# Patient Record
Sex: Female | Born: 1995 | Race: White | Hispanic: No | Marital: Married | Smoking: Former smoker
Health system: Southern US, Community
[De-identification: ages and names within clinical notes are randomized; demographics above are authoritative.]

## PROBLEM LIST (undated history)

## (undated) ENCOUNTER — Inpatient Hospital Stay: Payer: Self-pay

## (undated) DIAGNOSIS — F419 Anxiety disorder, unspecified: Secondary | ICD-10-CM

## (undated) DIAGNOSIS — R000 Tachycardia, unspecified: Secondary | ICD-10-CM

## (undated) DIAGNOSIS — F3181 Bipolar II disorder: Secondary | ICD-10-CM

## (undated) DIAGNOSIS — G43909 Migraine, unspecified, not intractable, without status migrainosus: Secondary | ICD-10-CM

## (undated) DIAGNOSIS — F329 Major depressive disorder, single episode, unspecified: Secondary | ICD-10-CM

## (undated) DIAGNOSIS — F32A Depression, unspecified: Secondary | ICD-10-CM

## (undated) HISTORY — DX: Anxiety disorder, unspecified: F41.9

## (undated) HISTORY — DX: Tachycardia, unspecified: R00.0

## (undated) HISTORY — PX: CHOLECYSTECTOMY: SHX55

## (undated) HISTORY — DX: Migraine, unspecified, not intractable, without status migrainosus: G43.909

## (undated) HISTORY — DX: Depression, unspecified: F32.A

## (undated) HISTORY — DX: Major depressive disorder, single episode, unspecified: F32.9

## (undated) HISTORY — DX: Bipolar II disorder: F31.81

---
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
## 2013-12-05 ENCOUNTER — Emergency Department: Payer: Self-pay | Admitting: Emergency Medicine

## 2013-12-05 LAB — BASIC METABOLIC PANEL
ANION GAP: 4 — AB (ref 7–16)
BUN: 12 mg/dL (ref 7–18)
CALCIUM: 9 mg/dL (ref 8.5–10.1)
Chloride: 107 mmol/L (ref 98–107)
Co2: 27 mmol/L (ref 21–32)
Creatinine: 0.83 mg/dL (ref 0.60–1.30)
GLUCOSE: 88 mg/dL (ref 65–99)
Osmolality: 275 (ref 275–301)
POTASSIUM: 3.9 mmol/L (ref 3.5–5.1)
Sodium: 138 mmol/L (ref 136–145)

## 2013-12-05 LAB — CBC
HCT: 43.2 % (ref 35.0–47.0)
HGB: 14.1 g/dL (ref 12.0–16.0)
MCH: 28.1 pg (ref 26.0–34.0)
MCHC: 32.6 g/dL (ref 32.0–36.0)
MCV: 86 fL (ref 80–100)
Platelet: 207 10*3/uL (ref 150–440)
RBC: 5.01 10*6/uL (ref 3.80–5.20)
RDW: 14 % (ref 11.5–14.5)
WBC: 6.3 10*3/uL (ref 3.6–11.0)

## 2013-12-05 LAB — URINALYSIS, COMPLETE
BACTERIA: NONE SEEN
BILIRUBIN, UR: NEGATIVE
BLOOD: NEGATIVE
GLUCOSE, UR: NEGATIVE mg/dL (ref 0–75)
KETONE: NEGATIVE
LEUKOCYTE ESTERASE: NEGATIVE
Nitrite: NEGATIVE
PH: 6 (ref 4.5–8.0)
PROTEIN: NEGATIVE
RBC,UR: 1 /HPF (ref 0–5)
SPECIFIC GRAVITY: 1.017 (ref 1.003–1.030)
Squamous Epithelial: 3

## 2013-12-08 ENCOUNTER — Encounter: Payer: Self-pay | Admitting: *Deleted

## 2013-12-08 ENCOUNTER — Encounter: Payer: Self-pay | Admitting: Cardiovascular Disease

## 2013-12-08 ENCOUNTER — Ambulatory Visit (INDEPENDENT_AMBULATORY_CARE_PROVIDER_SITE_OTHER): Payer: Self-pay | Admitting: Cardiovascular Disease

## 2013-12-08 VITALS — BP 128/80 | HR 89 | Ht 65.0 in | Wt 210.5 lb

## 2013-12-08 DIAGNOSIS — R079 Chest pain, unspecified: Secondary | ICD-10-CM | POA: Insufficient documentation

## 2013-12-08 DIAGNOSIS — R000 Tachycardia, unspecified: Secondary | ICD-10-CM | POA: Insufficient documentation

## 2013-12-08 DIAGNOSIS — R0602 Shortness of breath: Secondary | ICD-10-CM

## 2013-12-08 NOTE — Progress Notes (Signed)
   HPI  This is a 18 year old female who was referred from the emergency room at [facility] for evaluation of chest pain and dyspnea. She has no previous cardiac history and no chronic medical conditions. She has experienced palpitations and tachycardia throughout her life. She was started on propranolol about one month ago for that reason with partial improvement in symptoms. Over the last few months, she has experienced symptoms of substernal chest pain and shortness of breath. The pain is described as aching sensation with no radiation. It happens mostly at night and it wakes her up from sleep. There is no family history of heart disease. She smokes a few cigarettes a day and drinks alcohol socially. She denies any recreational drug use. She had routine labs done in March which was within normal limits including thyroid function.  Allergies  Allergen Reactions  . Imitrex [Sumatriptan]      No current outpatient prescriptions on file prior to visit.   No current facility-administered medications on file prior to visit.     History reviewed. No pertinent past medical history.   History reviewed. No pertinent past surgical history.   History reviewed. No pertinent family history.   History   Social History  . Marital Status: Single    Spouse Name: N/A    Number of Children: N/A  . Years of Education: N/A   Occupational History  . Not on file.   Social History Main Topics  . Smoking status: Current Some Day Smoker -- 2 years    Types: Cigarettes  . Smokeless tobacco: Not on file  . Alcohol Use: Yes     Comment: social  . Drug Use: No  . Sexual Activity: Not on file   Other Topics Concern  . Not on file   Social History Narrative  . No narrative on file     ROS A 10 point review of system was performed. It is negative other than that mentioned in the history of present illness.   PHYSICAL EXAM   BP 128/80  Pulse 89  Ht 5\' 5"  (1.651 m)  Wt 210 lb 8 oz (95.482  kg)  BMI 35.03 kg/m2 Constitutional: She is oriented to person, place, and time. She appears well-developed and well-nourished. No distress.  HENT: No nasal discharge.  Head: Normocephalic and atraumatic.  Eyes: Pupils are equal and round. No discharge.  Neck: Normal range of motion. Neck supple. No JVD present. No thyromegaly present.  Cardiovascular: Normal rate, regular rhythm, normal heart sounds. Exam reveals no gallop and no friction rub. No murmur heard.  Pulmonary/Chest: Effort normal and breath sounds normal. No stridor. No respiratory distress. She has no wheezes. She has no rales. She exhibits no tenderness.  Abdominal: Soft. Bowel sounds are normal. She exhibits no distension. There is no tenderness. There is no rebound and no guarding.  Musculoskeletal: Normal range of motion. She exhibits no edema and no tenderness.  Neurological: She is alert and oriented to person, place, and time. Coordination normal.  Skin: Skin is warm and dry. No rash noted. She is not diaphoretic. No erythema. No pallor.  Psychiatric: She has a normal mood and affect. Her behavior is normal. Judgment and thought content normal.     ZOX:WRUEAEKG:Sinus  Rhythm  WITHIN NORMAL LIMITS   ASSESSMENT AND PLAN

## 2013-12-08 NOTE — Assessment & Plan Note (Signed)
The chest pain is atypical and not anginal in nature. She complains of associated shortness of breath. Cardiac physical exam is unremarkable and baseline ECG is normal. I recommend an echocardiogram to ensure no structural heart abnormalities. If the echocardiogram is unremarkable, no further cardiac testing is advised. Evaluation of noncardiac causes might be appropriate.

## 2013-12-08 NOTE — Patient Instructions (Signed)
Your physician has requested that you have an echocardiogram. Echocardiography is a painless test that uses sound waves to create images of your heart. It provides your doctor with information about the size and shape of your heart and how well your heart's chambers and valves are working. This procedure takes approximately one hour. There are no restrictions for this procedure.   Your physician recommends that you schedule a follow-up appointment in:  Follow up as needed

## 2013-12-08 NOTE — Assessment & Plan Note (Signed)
This seems to be due to sinus tachycardia according to her description. When she feels palpitations, heart rate is typically not more than 120 beats per minute. There has been no syncope or presyncope. The utility of Holter monitor is very low in this situation. Symptoms improved with propranolol.

## 2013-12-20 ENCOUNTER — Other Ambulatory Visit: Payer: Self-pay

## 2013-12-20 ENCOUNTER — Other Ambulatory Visit (INDEPENDENT_AMBULATORY_CARE_PROVIDER_SITE_OTHER): Payer: Self-pay

## 2013-12-20 DIAGNOSIS — R079 Chest pain, unspecified: Secondary | ICD-10-CM

## 2013-12-20 DIAGNOSIS — R0602 Shortness of breath: Secondary | ICD-10-CM

## 2014-01-06 ENCOUNTER — Telehealth: Payer: Self-pay

## 2014-01-06 ENCOUNTER — Ambulatory Visit (INDEPENDENT_AMBULATORY_CARE_PROVIDER_SITE_OTHER): Payer: Self-pay | Admitting: *Deleted

## 2014-01-06 ENCOUNTER — Emergency Department: Payer: Self-pay | Admitting: Emergency Medicine

## 2014-01-06 VITALS — BP 146/90 | HR 80 | Ht 65.0 in | Wt 208.0 lb

## 2014-01-06 DIAGNOSIS — R079 Chest pain, unspecified: Secondary | ICD-10-CM

## 2014-01-06 LAB — COMPREHENSIVE METABOLIC PANEL
ALBUMIN: 3.8 g/dL (ref 3.4–5.0)
ALT: 22 U/L (ref 12–78)
AST: 11 U/L — AB (ref 15–37)
Alkaline Phosphatase: 69 U/L
Anion Gap: 8 (ref 7–16)
BUN: 12 mg/dL (ref 7–18)
Bilirubin,Total: 0.3 mg/dL (ref 0.2–1.0)
CHLORIDE: 104 mmol/L (ref 98–107)
Calcium, Total: 9.2 mg/dL (ref 8.5–10.1)
Co2: 27 mmol/L (ref 21–32)
Creatinine: 0.84 mg/dL (ref 0.60–1.30)
EGFR (African American): >60
GLUCOSE: 115 mg/dL — AB (ref 65–99)
Osmolality: 278 (ref 275–301)
Potassium: 3.3 mmol/L — ABNORMAL LOW (ref 3.5–5.1)
Sodium: 139 mmol/L (ref 136–145)
Total Protein: 7.2 g/dL (ref 6.4–8.2)

## 2014-01-06 LAB — CBC WITH DIFFERENTIAL/PLATELET
Basophil #: 0 10*3/uL (ref 0.0–0.1)
Basophil %: 0.3 %
EOS ABS: 0 10*3/uL (ref 0.0–0.7)
Eosinophil %: 0.4 %
HCT: 43.5 % (ref 35.0–47.0)
HGB: 14.2 g/dL (ref 12.0–16.0)
LYMPHS ABS: 1.3 10*3/uL (ref 1.0–3.6)
Lymphocyte %: 14.8 %
MCH: 28.4 pg (ref 26.0–34.0)
MCHC: 32.6 g/dL (ref 32.0–36.0)
MCV: 87 fL (ref 80–100)
MONO ABS: 0.6 x10 3/mm (ref 0.2–0.9)
Monocyte %: 6.7 %
NEUTROS ABS: 6.6 10*3/uL — AB (ref 1.4–6.5)
Neutrophil %: 77.8 %
Platelet: 210 10*3/uL (ref 150–440)
RBC: 5 10*6/uL (ref 3.80–5.20)
RDW: 13.6 % (ref 11.5–14.5)
WBC: 8.5 10*3/uL (ref 3.6–11.0)

## 2014-01-06 LAB — URINALYSIS, COMPLETE
BILIRUBIN, UR: NEGATIVE
Glucose,UR: NEGATIVE mg/dL (ref 0–75)
Leukocyte Esterase: NEGATIVE
Nitrite: NEGATIVE
Ph: 6 (ref 4.5–8.0)
Protein: NEGATIVE
RBC,UR: 6 /HPF (ref 0–5)
Specific Gravity: 1.026 (ref 1.003–1.030)
Squamous Epithelial: 26

## 2014-01-06 LAB — PREGNANCY, URINE: Pregnancy Test, Urine: NEGATIVE m[IU]/mL

## 2014-01-06 LAB — TROPONIN I: Troponin-I: <0.02 ng/mL

## 2014-01-06 LAB — LIPASE, BLOOD: LIPASE: 101 U/L (ref 73–393)

## 2014-01-06 NOTE — Progress Notes (Signed)
1.) Reason for visit: Chest and upper abdominal pain   2.) Name of MD requesting visit: Dr. Kirke CorinArida   3.) H&P:   18 yr old female   Seen at [facility] ED for chest pain   Normal EKG and Echo   4.) ROS related to problem:   Patient called today with mid sternal and upper abdomen pain   She stated it was 8/10 pain that felt "burning and gnawing"   She stated it was sharp and radiating at times   She denies indigestion or flatulence   She had pizza and macaroni and cheese to eat last night   She was flushed and clammy to the touch   Her blood pressure was mildly elevated    She became nauseous and almost vomited during her office visit   Assessment and plan per MD:  I instructed patient to: Rest at home  Follow a bland diet  Avoid spicy, fatty, acidic foods  Avoid alcohol and soft drinks  Try OTC antacid  Follow up with PCP on Monday or go to urgent care today   I advised her to go to the ED if she felt her situation became emergent  She stated she was afraid to return home in so much pain  She decided to go to the ED after leaving our office  I called ED Charge Nurse Tammy Sours(Greg) to let him know she was heading over

## 2014-01-06 NOTE — Telephone Encounter (Signed)
Patient called with chest pain and shortness of breath  It starts at the top of her chest and goes to the middle of her stomach  Feels like pressure, burning and pulling  Patient woke up feeling mild chest pain She took a nap and when she woke up it was worse   Advised patient to come in for nurse visit and EKG  Patient stated she will come in now

## 2014-01-06 NOTE — Telephone Encounter (Signed)
Pt called, states her "chest pains have flared up again"

## 2014-01-06 NOTE — Patient Instructions (Signed)
Please go to ED if your abdominal pain continues for abdominal ultra sound

## 2014-03-15 ENCOUNTER — Inpatient Hospital Stay: Payer: Self-pay | Admitting: Internal Medicine

## 2014-03-15 LAB — CBC WITH DIFFERENTIAL/PLATELET
BASOS PCT: 0.6 %
Basophil #: 0 10*3/uL (ref 0.0–0.1)
Eosinophil #: 0.1 10*3/uL (ref 0.0–0.7)
Eosinophil %: 0.8 %
HCT: 43.6 % (ref 35.0–47.0)
HGB: 14.6 g/dL (ref 12.0–16.0)
Lymphocyte #: 1.9 10*3/uL (ref 1.0–3.6)
Lymphocyte %: 28.7 %
MCH: 28.9 pg (ref 26.0–34.0)
MCHC: 33.4 g/dL (ref 32.0–36.0)
MCV: 87 fL (ref 80–100)
Monocyte #: 0.7 x10 3/mm (ref 0.2–0.9)
Monocyte %: 10 %
NEUTROS ABS: 4 10*3/uL (ref 1.4–6.5)
Neutrophil %: 59.9 %
Platelet: 238 10*3/uL (ref 150–440)
RBC: 5.04 10*6/uL (ref 3.80–5.20)
RDW: 14.1 % (ref 11.5–14.5)
WBC: 6.7 10*3/uL (ref 3.6–11.0)

## 2014-03-15 LAB — COMPREHENSIVE METABOLIC PANEL
ALT: 379 U/L — AB
Albumin: 3.8 g/dL (ref 3.4–5.0)
Alkaline Phosphatase: 182 U/L — ABNORMAL HIGH
Anion Gap: 8 (ref 7–16)
BUN: 9 mg/dL (ref 7–18)
Bilirubin,Total: 0.6 mg/dL (ref 0.2–1.0)
CO2: 29 mmol/L (ref 21–32)
Calcium, Total: 9.4 mg/dL (ref 8.5–10.1)
Chloride: 104 mmol/L (ref 98–107)
Creatinine: 0.9 mg/dL (ref 0.60–1.30)
EGFR (African American): >60
EGFR (Non-African Amer.): >60
GLUCOSE: 92 mg/dL (ref 65–99)
OSMOLALITY: 280 (ref 275–301)
Potassium: 4 mmol/L (ref 3.5–5.1)
SGOT(AST): 105 U/L — ABNORMAL HIGH (ref 15–37)
Sodium: 141 mmol/L (ref 136–145)
TOTAL PROTEIN: 7.8 g/dL (ref 6.4–8.2)

## 2014-03-15 LAB — LIPASE, BLOOD: Lipase: 135 U/L (ref 73–393)

## 2014-03-16 LAB — CBC WITH DIFFERENTIAL/PLATELET
BASOS PCT: 0.6 %
Basophil #: 0 10*3/uL (ref 0.0–0.1)
Eosinophil #: 0 10*3/uL (ref 0.0–0.7)
Eosinophil %: 0.7 %
HCT: 38.7 % (ref 35.0–47.0)
HGB: 12.3 g/dL (ref 12.0–16.0)
Lymphocyte #: 1.7 10*3/uL (ref 1.0–3.6)
Lymphocyte %: 31.4 %
MCH: 28.5 pg (ref 26.0–34.0)
MCHC: 31.9 g/dL — ABNORMAL LOW (ref 32.0–36.0)
MCV: 90 fL (ref 80–100)
Monocyte #: 0.5 x10 3/mm (ref 0.2–0.9)
Monocyte %: 8.9 %
Neutrophil #: 3.2 10*3/uL (ref 1.4–6.5)
Neutrophil %: 58.4 %
Platelet: 184 10*3/uL (ref 150–440)
RBC: 4.32 10*6/uL (ref 3.80–5.20)
RDW: 14.2 % (ref 11.5–14.5)
WBC: 5.5 10*3/uL (ref 3.6–11.0)

## 2014-03-16 LAB — COMPREHENSIVE METABOLIC PANEL
ALT: 295 U/L — AB
Albumin: 3 g/dL — ABNORMAL LOW (ref 3.4–5.0)
Alkaline Phosphatase: 156 U/L — ABNORMAL HIGH
Anion Gap: 9 (ref 7–16)
BUN: 8 mg/dL (ref 7–18)
Bilirubin,Total: 0.6 mg/dL (ref 0.2–1.0)
CHLORIDE: 109 mmol/L — AB (ref 98–107)
CO2: 26 mmol/L (ref 21–32)
Calcium, Total: 8.2 mg/dL — ABNORMAL LOW (ref 8.5–10.1)
Creatinine: 0.84 mg/dL (ref 0.60–1.30)
EGFR (African American): >60
GLUCOSE: 88 mg/dL (ref 65–99)
Osmolality: 285 (ref 275–301)
POTASSIUM: 3.7 mmol/L (ref 3.5–5.1)
SGOT(AST): 92 U/L — ABNORMAL HIGH (ref 15–37)
SODIUM: 144 mmol/L (ref 136–145)
Total Protein: 6.3 g/dL — ABNORMAL LOW (ref 6.4–8.2)

## 2014-03-17 LAB — URINALYSIS, COMPLETE
Bacteria: NONE SEEN
Bilirubin,UR: NEGATIVE
Blood: NEGATIVE
GLUCOSE, UR: NEGATIVE mg/dL (ref 0–75)
LEUKOCYTE ESTERASE: NEGATIVE
Nitrite: NEGATIVE
PH: 6 (ref 4.5–8.0)
Protein: NEGATIVE
RBC,UR: NONE SEEN /HPF (ref 0–5)
SPECIFIC GRAVITY: 1.024 (ref 1.003–1.030)
Squamous Epithelial: 5
WBC UR: 1 /HPF (ref 0–5)

## 2014-03-17 LAB — COMPREHENSIVE METABOLIC PANEL
ALK PHOS: 140 U/L — AB
AST: 79 U/L — AB (ref 15–37)
Albumin: 3 g/dL — ABNORMAL LOW (ref 3.4–5.0)
Anion Gap: 11 (ref 7–16)
BILIRUBIN TOTAL: 0.6 mg/dL (ref 0.2–1.0)
BUN: 5 mg/dL — ABNORMAL LOW (ref 7–18)
Calcium, Total: 8.5 mg/dL (ref 8.5–10.1)
Chloride: 105 mmol/L (ref 98–107)
Co2: 28 mmol/L (ref 21–32)
Creatinine: 0.87 mg/dL (ref 0.60–1.30)
EGFR (African American): >60
EGFR (Non-African Amer.): >60
GLUCOSE: 86 mg/dL (ref 65–99)
Osmolality: 283 (ref 275–301)
Potassium: 3.7 mmol/L (ref 3.5–5.1)
SGPT (ALT): 267 U/L — ABNORMAL HIGH
Sodium: 144 mmol/L (ref 136–145)
Total Protein: 6.1 g/dL — ABNORMAL LOW (ref 6.4–8.2)

## 2014-03-17 LAB — LIPASE, BLOOD: LIPASE: 317 U/L (ref 73–393)

## 2014-03-22 LAB — PATHOLOGY REPORT

## 2014-11-07 DIAGNOSIS — F3181 Bipolar II disorder: Secondary | ICD-10-CM | POA: Insufficient documentation

## 2014-11-07 DIAGNOSIS — G43909 Migraine, unspecified, not intractable, without status migrainosus: Secondary | ICD-10-CM | POA: Insufficient documentation

## 2014-11-07 DIAGNOSIS — R000 Tachycardia, unspecified: Secondary | ICD-10-CM | POA: Insufficient documentation

## 2014-11-18 NOTE — Consult Note (Signed)
ERCP performed. PD 1st cannulated. PD normal. CBD then cannulated. CBD dilated with single stone that was extracted after biliary sphincterotomy and balloon sweep. Clear liquid diet ordered. Check for any developement of pancreatitis. Continue to moniter LFT. thanks.  Electronic Signatures: Lutricia Feilh, Vidyuth Belsito (MD)  (Signed on 20-Aug-15 15:21)  Authored  Last Updated: 20-Aug-15 15:21 by Lutricia Feilh, Alexzander Dolinger (MD)

## 2014-11-18 NOTE — H&P (Signed)
PATIENT NAME:  Cynthia Francis, Cynthia Francis MR#:  [id number] DATE OF BIRTH:  [birth year]  DATE OF ADMISSION:  03/15/2014  PRIMARY CARE PHYSICIAN: None.   REFERRING PHYSICIAN: Dr. Shaune PollackLord.   CHIEF COMPLAINT: Abdominal pain.   HISTORY OF PRESENT ILLNESS: Ms. Cynthia Francis is a 19 year old female who was diagnosed with cholelithiasis about a month back and comes to the Emergency Department with complaints of right upper quadrant that started about a week back. The patient has been experiencing pain in the right upper quadrant, which has been gradually getting worse. She has been experiencing mild nausea. Had a few episodes of vomiting. Did not have any fever. Did not have any nausea and did not have any diarrhea. Concerning this, came to the Emergency Department. Work-up in the Emergency Department with ultrasound of the abdomen shows the common bile duct stone and cholelithiasis. The patient does not have elevated bilirubin; however, has elevated alkaline phosphatase of 182, ALT 379, AST of 105. Has normal white blood cell count.   PAST MEDICAL HISTORY: None.   PAST SURGICAL HISTORY: None.   ALLERGIES: SUMATRIPTAN.   HOME MEDICATIONS: Omeprazole 20 mg daily.   SOCIAL HISTORY: Continues to smoke 4 to 5 cigarettes a day. Denies drinking alcohol or using illicit drugs. Lives with her friend. Works as a Social workernanny.   FAMILY HISTORY: Mother with hypothyroidism.   REVIEW OF SYSTEMS:  CONSTITUTIONAL: Has generalized weakness.  EYES: No change in vision.  EARS, NOSE AND THROAT:  No change in hearing.   RESPIRATORY: No cough, shortness of breath.  CARDIOVASCULAR: No chest pain, palpations.  GASTROINTESTINAL: Has nausea, vomiting, abdominal pain.  GENITOURINARY: No dysuria or hematuria.  HEMATOLOGIC: No easy bruising or bleeding.  SKIN: No rash or lesions.  MUSCULOSKELETAL: No joint pains and aches.  NEUROLOGIC: No weakness or numbness in any part of the body.   PHYSICAL EXAMINATION:  GENERAL: This is a well-built,  well-nourished age-appropriate female lying down in the bed, not in distress.  VITAL SIGNS: Temperature 97.5, pulse 90, blood pressure 112/71, respiratory rate of 20, oxygen saturation is 96% on room air.  HEENT: Head normocephalic, atraumatic. There is no scleral icterus. Conjunctivae normal. Pupils equal and reactive to light. Mucous membranes moist. No pharyngeal erythema.  NECK: Supple. No lymphadenopathy. No JVD. No carotid bruit.  CHEST: Has no focal tenderness.  LUNGS: Bilaterally clear to auscultation.  HEART: S1, S2 regular. No murmurs are heard.  ABDOMEN: Bowel sounds are soft. Has tenderness in the right upper quadrant, mild tenderness. No rebound or guarding. Could not appreciate hepatosplenomegaly.  EXTREMITIES: No pedal edema. Pulses 2+.  SKIN: No rash or lesions.  MUSCULOSKELETAL: Good range of motion in all the extremities.  NEUROLOGIC: The patient is alert, oriented to place, person, and time. Cranial nerves II through XII intact. Motor 5/5 in upper and lower extremities.   LABORATORY DATA: CMP, alkaline phosphatase of 182, ALT 379, AST 105. Ultrasound of the right upper quadrant, CBD stone, multiple   stones. CBC is completely within normal limits.   ASSESSMENT AND PLAN: Ms. Cynthia Francis is a 19 year old female who comes to the Emergency Department with complaints of right upper quadrant is found to have cholelithiasis.   1. Choledocholithiasis consult gastroenterology. The patient will need ERCP and also consult general surgery for possible cholecystectomy. Keep the patient on Zosyn.  2. Tobacco use. Counseled the patient.  3. Keep the patient on deep vein thrombosis prophylaxis with Lovenox.   TIME SPENT: 50 minutes.    ____________________________ Susa GriffinsPadmaja Ashlye Oviedo, MD pv:JT  D: 03/15/2014 23:42:00 ET T: 03/16/2014 00:19:42 ET JOB#: [id number]  cc: Susa Griffins, MD, <Dictator> Susa Griffins MD ELECTRONICALLY SIGNED 03/18/2014 0:52

## 2014-11-18 NOTE — Consult Note (Signed)
PATIENT NAME:  Cynthia Francis, Cynthia Francis MR#:  [id number] DATE OF BIRTH:  05/21/1996  DATE OF CONSULTATION:  03/15/2014  PRIMARY CARE DOCTOR:  None.  REFERRING PHYSICIAN:  Susa GriffinsPadmaja Vasireddy, MD.  CONSULTING PHYSICIAN:  Hilbert CorriganKCGI, Rodman Keyawn S. Harrison, NP and Ezzard StandingPaul Y. Oh, MD.  REASON FOR CONSULTATION:  Common bile duct stone .   HISTORY OF PRESENT ILLNESS: Cynthia Francis is a very pleasant 19 year old Caucasian female who was diagnosed with cholelithiasis about a month ago. She had been seen in the Emergency Room at that time. She was complaining or right upper quadrant abdominal pain.  At that point, she recommend to proceed with cholecystectomy and then opted not to.  Pain started back about a week ago again, experiencing again to the right upper quadrant gradually progressively has been getting worse, associated nausea with episodes of vomiting.  Has  remained afebrile. She presented to the Emergency Room due to the recurrence of abdominal pain. She had an abdominal ultrasound done which did reveal evidence of common bile duct stone measuring 6 mm in size, as well as cholelithiasis.  Noted elevation in alkaline phosphatase level of 182. ALT 379 and AST of 105. Today total protein is 6.3 with albumin of 3.0, alkaline phosphatase 156, AST is 92 and ALT is 295. She is currently resting comfortably, but she has just received morphine sulfate. She is receiving Zosyn 3.375 grams IV every 4 hours. She did receive Lovenox 40 grams yesterday evening at approximately 9:00.  She has remained n.p.o. White count is currently 5.5. The patient states that she is feeling better since being admitted.   PAST MEDICAL HISTORY: Unremarkable.   PAST SURGICAL HISTORY: Unremarkable.   ALLERGIES: SUMATRIPTAN.  HOME MEDICATIONS: Omeprazole 20 mg once a day.   SOCIAL HISTORY: 4-5 cigarettes a day. No alcohol. Resides with her friend. She works as a Social workernanny.   FAMILY HISTORY: Mother with hypothyroidism.   REVIEW OF SYSTEMS:   CONSTITUTIONAL: Significant for generalized fatigue, weakness. No significant weight loss or weight gain.  EYES: No blurred vision, double vision.  EARS, NOSE AND THROAT: No change in hearing.  RESPIRATORY: No coughing, no wheezing and no shortness of breath.  CARDIOVASCULAR: No chest pain, heart palpitations.  GASTROINTESTINAL: See HPI.  GENITOURINARY: No dysuria or hematuria.  HEMATOLOGIC: Denies easy bruising and bleeding, rashes. No lesions. No masses.  MUSCULOSKELETAL: No neuralgias, myalgias.  NEUROLOGIC: No history of TIA, CVA, seizure disorder or weakness to any part of her body.   PHYSICAL EXAMINATION: GENERAL: Well developed, overweight, 19 year old Caucasian female, no acute distress noted. Resting comfortably in bed.  VITAL SIGNS:  Temperature is 97.4 with a pulse of 81, respirations 18, blood pressure is 100/63 with a pulse oximetry of 100% on room air.  HEENT: Normocephalic, atraumatic. Pupils equal, reactive to light. Conjunctivae clear. Sclerae anicteric.  NECK: Supple. Trachea midline. No lymphadenopathy or thyromegaly.  PULMONARY: Symmetric rise and fall of chest. Clear to auscultation throughout.  CARDIOVASCULAR: Regular rhythm, S1, S2. No murmurs, no gallops.  ABDOMEN: Soft, nondistended. Mild discomfort entire upper abdomen.  Upper abdomen more localized to right upper quadrant. No bruits. No masses.  Slightly  hypoactive bowel sounds. No evidence of hepatosplenomegaly.  RECTAL: Deferred.  MUSCULOSKELETAL: Moves all 4 extremities. No contractures. No clubbing.  EXTREMITIES: No edema.  PSYCHIATRIC: Alert and oriented x4. Memory grossly intact. Appropriate affect and mood.  NEUROLOGICAL: No gross neurological deficits.   LABORATORY DATA AND DIAGNOSTIC DATA: Chemistry panel on admission was normal. Comparison on today's date, chloride is  elevated at 109 with a calcium declined to 8.2. Hepatic panel as discussed under history. CBC on admission completely within normal  limits and currently MCHC is low at 31.9, but otherwise within normal limits. Abdominal ultrasound report again revealed common bile duct measuring 6 mm in size. Impression is cholelithiasis with mild gallbladder wall thickening and positive sonographic Murphy's sign suggesting acute cholecystitis and the dilatation of common bile duct with the common bile duct stone.   IMPRESSION:  Cholelithiasis with mild gallbladder wall thickening and positive sonographic Murphy's sign suggesting acute cholecystitis.   PLAN: The patient's presentation as already been discussed with Dr. Lutricia Feil. Given the finding of common bile duct stone, recommendation is to proceed forward with ERCP today. Order has been placed. Procedure, risks versus benefits were discussed with the patient as well as her family members, grandmother, and mother who are present. The patient will remain n.p.o. at this time. We will continue to monitor. She should continue to receive Zosyn as scheduled and ordered.   Owens Shark, NP, under collaboration with Dr. Lutricia Feil.      ____________________________ Rodman Key, NP dsh:DT D: 03/16/2014 12:42:00 ET T: 03/16/2014 15:11:17 ET JOB#: [id number]  cc: Rodman Key, NP, <Dictator> Rodman Key MD ELECTRONICALLY SIGNED 03/21/2014 8:03

## 2014-11-18 NOTE — Discharge Summary (Signed)
PATIENT NAME:  Cynthia Francis, Cynthia Francis MR#:  [id number] DATE OF BIRTH:  Sep 20, 1995  DATE OF ADMISSION:  03/15/2014 DATE OF DISCHARGE:  03/18/2014  DISCHARGE DIAGNOSES:  1.  Choledocholithiasis, status post endoscopic retrograde cholangiopancreatography. 2.  Cholelithiasis, status post laparoscopic cholecystectomy.   PROCEDURES:   1.  Status post endoscopic retrograde cholangiopancreatography  for common bile duct stone removal.  2. Status post cholecystectomy.  CODE STATUS: Full code.   MEDICATIONS:  1.  Acetaminophen/oxycodone 5/325 mg one every 6 hours as needed.  2.  Omeprazole 20 mg daily.   FOLLOWUP:  Follow up with Dr. Egbert GaribaldiBird next Tuesday.   SURGICAL CONSULTATION:  Dr. Egbert GaribaldiBird.   GASTROINTESTINAL CONSULTATION:  Dr. Bluford Kaufmannh.  BRIEF SUMMARY OF HOSPITAL COURSE:  [name] is a 19 year old obese Caucasian female with no significant past medical history who comes in with abdominal pain who was admitted with:  1.  Choledocholithiasis.  She is status post ERCP with biliary sphincterotomy and balloon sweep with removal of CBD stone on August 20.  The patient was continued on IV fluids, seen by Dr. Egbert GaribaldiBird, who recommended a laparoscopic cholecystectomy which was done on August 21.  She was stable postop and was able to tolerate a p.o. diet.  2.  Elevated LFTs due to #1. 3.  Tobacco abuse.  Counseled on smoking cessation.   Hospital stay otherwise remained stable. The patient remained a full code.    TIME SPENT:  40 minutes.     ____________________________ Wylie HailSona A. Allena KatzPatel, MD sap:nr D: 03/23/2014 19:30:34 ET T: 03/23/2014 23:35:31 ET JOB#: [id number]  cc: Quintarius Ferns A. Allena KatzPatel, MD, <Dictator> Willow OraSONA A Benjerman Molinelli MD ELECTRONICALLY SIGNED 04/04/2014 14:58

## 2014-11-18 NOTE — Consult Note (Signed)
Brief Consult Note: Diagnosis: Acute cholecystitis.  CBD stone.   Consult note dictated.   Recommend to proceed with surgery or procedure.   Discussed with Attending MD.   Comments: Patient's presentation has been already discussed with Dr. Lutricia FeilPaul Oh.  Recommendation is to proceed with ERCP today for the indication of CBD stone.  Order has already been placed by Dr. Bluford Kaufmannh.  Procedure, risks versus benefits have been discussed with patient as well as her family members.  Continue Zosyn 3.375 gm IV every hours as ordered.  NPO status to remain in place.  Will continue to monitor patient. Pending surgical consultation recommendations.  She has already been seen by Dr. Colette RibasByrd and prior to this admission she was already scheduled for cholecytectomy next Tuesday.  Electronic Signatures: Rodman KeyHarrison, Leomia Blake S (NP)  (Signed 20-Aug-15 12:45)  Authored: Brief Consult Note   Last Updated: 20-Aug-15 12:45 by Rodman KeyHarrison, Earsel Shouse S (NP)

## 2014-11-18 NOTE — Op Note (Signed)
PATIENT NAME:  Cynthia Francis, [name] MR#:  [id number] DATE OF BIRTH:  [birth year]  DATE OF PROCEDURE:  03/17/2014   PREOPERATIVE DIAGNOSIS: Symptomatic cholelithiasis, choledocholithiasis, status post endoscopic retrograde cholangiopancreatogram.   POSTOPERATIVE DIAGNOSIS: Symptomatic cholelithiasis, choledocholithiasis, status post endoscopic retrograde cholangiopancreatogram.   PROCEDURE PERFORMED:  Laparoscopic cholecystectomy.    SURGEON:  Natale LayMark Murriel Holwerda, MD FACS  ASSISTANT:  Scrub tech, type 2.  ANESTHESIA: General endotracheal.   FINDINGS: Stones.   SPECIMENS: Gallbladder with contents.   ESTIMATED BLOOD LOSS: Minimal.   LAP and needle count correct x 2.   DESCRIPTION OF PROCEDURE: With informed consent in the supine position, the patient's abdomen was widely prepped and draped with ChloraPrep solution. Timeout was observed.   A 12 mm blunt Hassan trocar was placed through an open technique through an infraumbilical transversely oriented skin incision with stay sutures being passed through the fascia.  Pneumoperitoneum was established to a total of 15 mmHg.  The patient was then positioned in reverse Trendelenburg and  right side up.  A 5 mm Bladeless trocar was placed in the epigastrium and two 5 mm in the right subcostal margin.   The gallbladder was grasped along its fundus and elevated towards the right shoulder.  Lateral traction was achieved on Hartmann's pouch.  Dissection of the hepatoduodenal ligament utilizing hook electrocautery and blunt technique demonstrated a critical view of safety cholecystectomy. Three clips were placed on the portal side of the cystic duct, 1 on the gallbladder side and the structure was then divided.  Cystic artery was divided in continuity with the lymph node utilizing 3 clips on the portal side, single clip on the gallbladder side and sharp scissors. Further dissection demonstrated no evidence of a posterior cystic artery branch or aberrant bile duct or  artery. The gallbladder was then retrieved off the gallbladder fossa utilizing hook cautery apparatus, placed into an Endo Catch device and retrieved. During the retrieving process, periumbilical port site demonstrated no evidence of bowel injury.   The right upper quadrant was irrigated with a total of 500 mL of warm normal saline and aspirated dry. Hemostasis appeared to be adequate on the operative field.  Ports were then removed under direct visualization.   The infraumbilical fascial defect was closed with an additional figure-of-eight #0 Vicryl suture in vertical orientation. The existing stay sutures tied to each other.   Total of 30 mL of 0.25% plain Marcaine was infiltrated along all skin and fascial incisions prior to closure.   Then, 4-0 Vicryl subcuticular was applied to all skin edges followed by benzoin, Steri-Strips, Telfa and Tegaderm. The patient was then subsequently extubated and taken to the recovery room in stable and satisfactory condition by anesthesia services.     ____________________________ Redge GainerMark A. Egbert GaribaldiBird, MD mab:DT D: 03/17/2014 13:28:35 ET T: 03/17/2014 14:53:38 ET JOB#: [id number]  cc: Loraine LericheMark A. Egbert GaribaldiBird, MD, <Dictator> Raynald KempMARK A Kasey Ewings MD ELECTRONICALLY SIGNED 03/19/2014 19:09

## 2015-01-16 ENCOUNTER — Ambulatory Visit: Payer: Self-pay | Admitting: Family Medicine

## 2015-01-19 ENCOUNTER — Encounter: Payer: Self-pay | Admitting: Family Medicine

## 2015-01-19 ENCOUNTER — Ambulatory Visit (INDEPENDENT_AMBULATORY_CARE_PROVIDER_SITE_OTHER): Payer: Self-pay | Admitting: Family Medicine

## 2015-01-19 VITALS — BP 117/84 | HR 124 | Temp 99.1°F | Ht 65.3 in | Wt 223.2 lb

## 2015-01-19 DIAGNOSIS — M9901 Segmental and somatic dysfunction of cervical region: Secondary | ICD-10-CM

## 2015-01-19 DIAGNOSIS — M545 Low back pain, unspecified: Secondary | ICD-10-CM | POA: Insufficient documentation

## 2015-01-19 DIAGNOSIS — M542 Cervicalgia: Secondary | ICD-10-CM | POA: Insufficient documentation

## 2015-01-19 DIAGNOSIS — M9905 Segmental and somatic dysfunction of pelvic region: Secondary | ICD-10-CM

## 2015-01-19 DIAGNOSIS — M9900 Segmental and somatic dysfunction of head region: Secondary | ICD-10-CM

## 2015-01-19 DIAGNOSIS — M9904 Segmental and somatic dysfunction of sacral region: Secondary | ICD-10-CM

## 2015-01-19 DIAGNOSIS — M9902 Segmental and somatic dysfunction of thoracic region: Secondary | ICD-10-CM
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
## 2015-01-19 NOTE — Assessment & Plan Note (Addendum)
In acute exacerbation at this time due to sitting for an extended period of time while she was in the car. This appears to be myofasical in nature and she does appear to have some somatic dysfunctions that I think could benefit from OMT. Treated today as discussed below.

## 2015-01-19 NOTE — Assessment & Plan Note (Signed)
Low back pain appears to be in acute exacerbation due to extended sitting going back and forth to [place]. This appears to be myofasical in nature and she does appear to have some somatic dysfunctions that I think could benefit from OMT. Treated today as discussed below.

## 2015-07-05 ENCOUNTER — Emergency Department (HOSPITAL_COMMUNITY)
Admission: EM | Admit: 2015-07-05 | Discharge: 2015-07-06 | Disposition: A | Discharge status: Home / Self Care | Payer: Self-pay | Attending: Emergency Medicine | Admitting: Emergency Medicine

## 2015-07-05 ENCOUNTER — Encounter (HOSPITAL_COMMUNITY): Payer: Self-pay | Admitting: *Deleted

## 2015-07-05 DIAGNOSIS — O209 Hemorrhage in early pregnancy, unspecified: Secondary | ICD-10-CM

## 2015-07-05 DIAGNOSIS — O200 Threatened abortion: Secondary | ICD-10-CM | POA: Insufficient documentation

## 2015-07-05 DIAGNOSIS — O99331 Smoking (tobacco) complicating pregnancy, first trimester: Secondary | ICD-10-CM | POA: Insufficient documentation

## 2015-07-05 DIAGNOSIS — R319 Hematuria, unspecified: Secondary | ICD-10-CM

## 2015-07-05 DIAGNOSIS — F17210 Nicotine dependence, cigarettes, uncomplicated: Secondary | ICD-10-CM | POA: Insufficient documentation

## 2015-07-05 DIAGNOSIS — Z3A01 Less than 8 weeks gestation of pregnancy: Secondary | ICD-10-CM | POA: Insufficient documentation

## 2015-07-05 DIAGNOSIS — Z79899 Other long term (current) drug therapy: Secondary | ICD-10-CM | POA: Insufficient documentation

## 2015-07-05 DIAGNOSIS — Z8659 Personal history of other mental and behavioral disorders: Secondary | ICD-10-CM | POA: Insufficient documentation

## 2015-07-05 DIAGNOSIS — Z8679 Personal history of other diseases of the circulatory system: Secondary | ICD-10-CM | POA: Insufficient documentation

## 2015-07-05 DIAGNOSIS — O23591 Infection of other part of genital tract in pregnancy, first trimester: Secondary | ICD-10-CM | POA: Insufficient documentation

## 2015-07-05 DIAGNOSIS — O2341 Unspecified infection of urinary tract in pregnancy, first trimester: Secondary | ICD-10-CM | POA: Insufficient documentation

## 2015-07-05 DIAGNOSIS — R000 Tachycardia, unspecified: Secondary | ICD-10-CM | POA: Insufficient documentation

## 2015-07-05 DIAGNOSIS — A5901 Trichomonal vulvovaginitis: Secondary | ICD-10-CM

## 2015-07-05 DIAGNOSIS — N390 Urinary tract infection, site not specified: Secondary | ICD-10-CM

## 2015-07-05 MED ORDER — SODIUM CHLORIDE 0.9 % IV SOLN
1000.0000 mL | Freq: Once | INTRAVENOUS | Status: AC
  Administered 2015-07-06: 1000 mL via INTRAVENOUS

## 2015-07-05 MED ORDER — SODIUM CHLORIDE 0.9 % IV SOLN: 1000.0000 mL | INTRAVENOUS | Status: DC

## 2015-07-05 NOTE — ED Notes (Signed)
Pt states that she has had lower abd cramping for the last few days; pt states that she is approx [redacted] weeks pregnant and has spotting for the last 2 days;  Pt states that it has been light spotting to almost a flow like a period; pt states that she is also having pain with urination and has urinary frequency

## 2015-07-05 NOTE — ED Provider Notes (Signed)
CSN: [id number]     Arrival date & time 07/05/15  2245 History  By signing my name below, I, Gonzella Lex, attest that this documentation has been prepared under the direction and in the presence of Dione Booze, MD. Electronically Signed: Gonzella Lex, Scribe. 07/05/2015. 11:59 PM.   Chief Complaint  Patient presents with  . Abdominal Pain    The history is provided by the patient. No language interpreter was used.    HPI Comments: Cynthia Francis is a 19 y.o. female who is [redacted] weeks pregnant, who presents to the Emergency Department complaining of worsening cramps and intermittent bleeding and spotting onset three days ago. She reports her pain is currently a 2/10 but notes that her pain has been as high as 9/10 while having cramps. Pt also reports oddly colored and malodorous discharge. She reports her LNMP was October 15th-18th. She notes that she has tried stretching and yoga with little to no relief.  Pt denies hx of previous pregnancies or abortions. Pt is in the process of applying for medicaid for prenatal care through the health department and notes that she plans on going to the [facility] for prenatal care as well.  Past Medical History  Diagnosis Date  . Bipolar 2 disorder (HCC)   . Migraine   . Tachycardia   . Depression   . Anxiety    Past Surgical History  Procedure Laterality Date  . Cholecystectomy     Family History  Problem Relation Age of Onset  . Thyroid disease Mother   . Arthritis Brother   . Hypertension Maternal Grandmother   . Hyperlipidemia Maternal Grandfather    Social History  Substance Use Topics  . Smoking status: Current Some Day Smoker -- 0.50 packs/day for 2 years    Types: Cigarettes  . Smokeless tobacco: Never Used  . Alcohol Use: Yes     Comment: social   OB History    No data available     Review of Systems  Gastrointestinal: Positive for abdominal pain.  Genitourinary: Positive for vaginal bleeding and  vaginal discharge.  All other systems reviewed and are negative.  Allergies  Imitrex  Home Medications   Prior to Admission medications   Medication Sig Start Date End Date Taking? Authorizing Provider  acetaminophen (TYLENOL) 160 MG chewable tablet Chew 320 mg by mouth every 6 (six) hours as needed for pain.   Yes Historical Provider, MD  Prenatal Vit-Fe Fumarate-FA (PRENATAL MULTIVITAMIN) TABS tablet Take 1 tablet by mouth daily at 12 noon.   Yes Historical Provider, MD   BP 154/93 mmHg  Pulse 120  Temp(Src) 98.1 F (36.7 C) (Oral)  Resp 18  Ht 5\' 6"  (1.676 m)  Wt 225 lb (102.059 kg)  BMI 36.33 kg/m2  SpO2 100%  LMP 05/09/2015 Physical Exam  Constitutional: She is oriented to person, place, and time. She appears well-developed and well-nourished. No distress.  HENT:  Head: Normocephalic and atraumatic.  Eyes: EOM are normal. Pupils are equal, round, and reactive to light.  Neck: Normal range of motion. Neck supple. No JVD present.  Cardiovascular: Regular rhythm and normal heart sounds.   No murmur heard. Tachycardic  Pulmonary/Chest: Effort normal and breath sounds normal. She has no wheezes. She has no rales. She exhibits no tenderness.  Abdominal: Soft. Bowel sounds are normal. She exhibits no distension and no mass. There is no tenderness.  Genitourinary:  Normal external female genitalia. Cervix is closed and no bleeding seen. Fundus  is 6-8 week size. No adnexal masses or tenderness.  Chaperone (RN) was present for exam which was performed with no discomfort or complications.     Musculoskeletal: Normal range of motion. She exhibits no edema.  Lymphadenopathy:    She has no cervical adenopathy.  Neurological: She is alert and oriented to person, place, and time. No cranial nerve deficit. She exhibits normal muscle tone. Coordination normal.  Skin: Skin is warm and dry. No rash noted.  Psychiatric: She has a normal mood and affect. Her behavior is normal. Judgment  and thought content normal.  Nursing note and vitals reviewed.   ED Course  Procedures  DIAGNOSTIC STUDIES:    Oxygen Saturation is 100% on RA, normal by my interpretation.   COORDINATION OF CARE:  11:52 PM Will draw blood for analysis, will determine pt's blood type, will perform an internal pelvic exam, and will perform an ultra-sound. Discussed treatment plan with pt at bedside and pt agreed to plan.   Labs Review Results for orders placed or performed during the hospital encounter of 07/05/15  Wet prep, genital  Result Value Ref Range   Yeast Wet Prep HPF POC NONE SEEN NONE SEEN   Trich, Wet Prep PRESENT (A) NONE SEEN   Clue Cells Wet Prep HPF POC NONE SEEN NONE SEEN   WBC, Wet Prep HPF POC MANY (A) NONE SEEN   Sperm NONE SEEN   Basic metabolic panel  Result Value Ref Range   Sodium 136 135 - 145 mmol/L   Potassium 3.8 3.5 - 5.1 mmol/L   Chloride 101 101 - 111 mmol/L   CO2 26 22 - 32 mmol/L   Glucose, Bld 100 (H) 65 - 99 mg/dL   BUN 11 6 - 20 mg/dL   Creatinine, Ser 1.61 0.44 - 1.00 mg/dL   Calcium 9.8 8.9 - 09.6 mg/dL   GFR calc non Af Amer >60 >60 mL/min   GFR calc Af Amer >60 >60 mL/min   Anion gap 9 5 - 15  CBC with Differential  Result Value Ref Range   WBC 10.5 4.0 - 10.5 K/uL   RBC 5.20 (H) 3.87 - 5.11 MIL/uL   Hemoglobin 15.8 (H) 12.0 - 15.0 g/dL   HCT 04.5 40.9 - 81.1 %   MCV 88.3 78.0 - 100.0 fL   MCH 30.4 26.0 - 34.0 pg   MCHC 34.4 30.0 - 36.0 g/dL   RDW 91.4 78.2 - 95.6 %   Platelets 202 150 - 400 K/uL   Neutrophils Relative % 70 %   Neutro Abs 7.4 1.7 - 7.7 K/uL   Lymphocytes Relative 22 %   Lymphs Abs 2.3 0.7 - 4.0 K/uL   Monocytes Relative 7 %   Monocytes Absolute 0.7 0.1 - 1.0 K/uL   Eosinophils Relative 1 %   Eosinophils Absolute 0.1 0.0 - 0.7 K/uL   Basophils Relative 0 %   Basophils Absolute 0.0 0.0 - 0.1 K/uL  Urinalysis, Routine w reflex microscopic  Result Value Ref Range   Color, Urine YELLOW YELLOW   APPearance TURBID (A) CLEAR    Specific Gravity, Urine 1.022 1.005 - 1.030   pH 5.5 5.0 - 8.0   Glucose, UA NEGATIVE NEGATIVE mg/dL   Hgb urine dipstick MODERATE (A) NEGATIVE   Bilirubin Urine NEGATIVE NEGATIVE   Ketones, ur NEGATIVE NEGATIVE mg/dL   Protein, ur NEGATIVE NEGATIVE mg/dL   Nitrite POSITIVE (A) NEGATIVE   Leukocytes, UA LARGE (A) NEGATIVE  hCG, quantitative, pregnancy  Result Value Ref  Range   hCG, Beta Chain, Quant, S [id number] (H) <5 mIU/mL  Urine microscopic-add on  Result Value Ref Range   Squamous Epithelial / LPF 6-30 (A) NONE SEEN   WBC, UA TOO NUMEROUS TO COUNT 0 - 5 WBC/hpf   RBC / HPF 0-5 0 - 5 RBC/hpf   Bacteria, UA MANY (A) NONE SEEN   Crystals CA OXALATE CRYSTALS (A) NEGATIVE   Trichomonas, UA PRESENT   I-Stat CG4 Lactic Acid, ED  Result Value Ref Range   Lactic Acid, Venous 1.00 0.5 - 2.0 mmol/L  ABO/Rh  Result Value Ref Range   ABO/RH(D) A POS    No rh immune globuloin NOT A RH IMMUNE GLOBULIN CANDIDATE, PT RH POSITIVE    Imaging Review [place] Ob Comp Less 14 Wks  07/06/2015  CLINICAL DATA:  19 year old fragment female with first trimester bleeding and cramping. EXAM: OBSTETRIC <14 WK US AND TRANSVAGINAL OB US TECHNIQUE: Both transabdominal and transvaginal ultrasound examinations were performed for complete evaluation of the gestation as well as the maternal uterus, adnexal regions, and pelvic cul-de-sac. Transvaginal technique was performed to assess early pregnancy. COMPARISON:  None. FINDINGS: Intrauterine gestational sac: Visualized/normal in shape. Yolk sac:  Not seen Embryo:  Present Cardiac Activity: Not detected Heart Rate: NA  bpm CRL:  11  mm   7 w   2 d                  US EDC: 02/20/2016 There is a small subchorionic hemorrhage measuring approximately 1 cm. Maternal uterus/adnexae: The right ovary measures 3.2 x 2.1 x 2.7 cm and the left ovary measures 2.5 x 2.0 x 2.4 cm. A 1.4 x 1.0 x 1.4 cm corpus luteum is noted in the left ovary. IMPRESSION: Single intrauterine pregnancy with  an estimated gestational age of [redacted] weeks, 2 days. No fetal cardiac activity identified. These results were called by telephone at the time of interpretation on 07/06/2015 at 1:35 am to Dr. Dione BoozeAVID Tashaun Obey , who verbally acknowledged these results. Electronically Signed   By: Elgie CollardArash  Radparvar M.D.   On: 07/06/2015 01:37   [place] Ob Transvaginal  07/06/2015  CLINICAL DATA:  19 year old fragment female with first trimester bleeding and cramping. EXAM: OBSTETRIC <14 WK US AND TRANSVAGINAL OB US TECHNIQUE: Both transabdominal and transvaginal ultrasound examinations were performed for complete evaluation of the gestation as well as the maternal uterus, adnexal regions, and pelvic cul-de-sac. Transvaginal technique was performed to assess early pregnancy. COMPARISON:  None. FINDINGS: Intrauterine gestational sac: Visualized/normal in shape. Yolk sac:  Not seen Embryo:  Present Cardiac Activity: Not detected Heart Rate: NA  bpm CRL:  11  mm   7 w   2 d                  US EDC: 02/20/2016 There is a small subchorionic hemorrhage measuring approximately 1 cm. Maternal uterus/adnexae: The right ovary measures 3.2 x 2.1 x 2.7 cm and the left ovary measures 2.5 x 2.0 x 2.4 cm. A 1.4 x 1.0 x 1.4 cm corpus luteum is noted in the left ovary. IMPRESSION: Single intrauterine pregnancy with an estimated gestational age of [redacted] weeks, 2 days. No fetal cardiac activity identified. These results were called by telephone at the time of interpretation on 07/06/2015 at 1:35 am to Dr. Dione BoozeAVID Rajvir Ernster , who verbally acknowledged these results. Electronically Signed   By: Elgie CollardArash  Radparvar M.D.   On: 07/06/2015 01:37   I have personally reviewed and evaluated these  images and lab results as part of my medical decision-making. I have personally discussed findings with radiologist.   MDM   Final diagnoses:  First trimester bleeding  Threatened miscarriage  Trichomonal vaginitis during pregnancy in first trimester  Urinary tract infection with  hematuria, site unspecified    First trimester bleeding. Ultrasound shows fetus of 7 weeks 2 days without cardiac activity which indicates intrauterine fetal demise. These findings were explained to the patient. She does show evidence of probable urinary tract infection and will be treated with nitrofurantoin. She also be given prescription for oxycodone-acetaminophen for pain. She is referred to women's clinic for follow-up in 3 days. Wet prep shows presence of trichomonas. She is given a single dose of metronidazole.  I personally performed the services described in this documentation, which was scribed in my presence. The recorded information has been reviewed and is accurate.         Dione Booze, MD 07/06/15 [phone]

## 2015-07-05 NOTE — ED Notes (Signed)
Pt from home states she is about [redacted] weeks pregnant. Pt has been having bleeding for 2 days that began as spotting, but but is currently "as heavy as her period normally is". Pt states she also began having discharge today that was yellow and "smells funny". Pt has urinary frequency. Pt stated she has abdominal cramping that she rates as 3/10. Pt denies n/v/d and deines pain with urination.

## 2015-07-06 ENCOUNTER — Emergency Department (HOSPITAL_COMMUNITY): Payer: Self-pay

## 2015-07-06 LAB — BASIC METABOLIC PANEL
Anion gap: 9 (ref 5–15)
BUN: 11 mg/dL (ref 6–20)
CHLORIDE: 101 mmol/L (ref 101–111)
CO2: 26 mmol/L (ref 22–32)
Calcium: 9.8 mg/dL (ref 8.9–10.3)
Creatinine, Ser: 0.73 mg/dL (ref 0.44–1.00)
GFR calc Af Amer: >60 mL/min (ref >60)
GFR calc non Af Amer: >60 mL/min (ref >60)
GLUCOSE: 100 mg/dL — AB (ref 65–99)
POTASSIUM: 3.8 mmol/L (ref 3.5–5.1)
Sodium: 136 mmol/L (ref 135–145)

## 2015-07-06 LAB — HIV ANTIBODY (ROUTINE TESTING W REFLEX): HIV Screen 4th Generation wRfx: NONREACTIVE

## 2015-07-06 LAB — CBC WITH DIFFERENTIAL/PLATELET
Basophils Absolute: 0 10*3/uL (ref 0.0–0.1)
Basophils Relative: 0 %
EOS PCT: 1 %
Eosinophils Absolute: 0.1 10*3/uL (ref 0.0–0.7)
HCT: 45.9 % (ref 36.0–46.0)
Hemoglobin: 15.8 g/dL — ABNORMAL HIGH (ref 12.0–15.0)
LYMPHS ABS: 2.3 10*3/uL (ref 0.7–4.0)
LYMPHS PCT: 22 %
MCH: 30.4 pg (ref 26.0–34.0)
MCHC: 34.4 g/dL (ref 30.0–36.0)
MCV: 88.3 fL (ref 78.0–100.0)
MONO ABS: 0.7 10*3/uL (ref 0.1–1.0)
MONOS PCT: 7 %
Neutro Abs: 7.4 10*3/uL (ref 1.7–7.7)
Neutrophils Relative %: 70 %
PLATELETS: 202 10*3/uL (ref 150–400)
RBC: 5.2 MIL/uL — AB (ref 3.87–5.11)
RDW: 13.2 % (ref 11.5–15.5)
WBC: 10.5 10*3/uL (ref 4.0–10.5)

## 2015-07-06 LAB — URINE MICROSCOPIC-ADD ON

## 2015-07-06 LAB — URINALYSIS, ROUTINE W REFLEX MICROSCOPIC
BILIRUBIN URINE: NEGATIVE
Glucose, UA: NEGATIVE mg/dL
Ketones, ur: NEGATIVE mg/dL
Nitrite: POSITIVE — AB
PROTEIN: NEGATIVE mg/dL
Specific Gravity, Urine: 1.022 (ref 1.005–1.030)
pH: 5.5 (ref 5.0–8.0)

## 2015-07-06 LAB — ABO/RH: ABO/RH(D): A POS

## 2015-07-06 LAB — WET PREP, GENITAL
Clue Cells Wet Prep HPF POC: NONE SEEN
Sperm: NONE SEEN
YEAST WET PREP: NONE SEEN

## 2015-07-06 LAB — HCG, QUANTITATIVE, PREGNANCY: HCG, BETA CHAIN, QUANT, S: 29946 m[IU]/mL — AB (ref <5)

## 2015-07-06 LAB — I-STAT CG4 LACTIC ACID, ED: LACTIC ACID, VENOUS: 1 mmol/L (ref 0.5–2.0)

## 2015-07-06 LAB — GC/CHLAMYDIA PROBE AMP (~~LOC~~) NOT AT ARMC
Chlamydia: NEGATIVE
NEISSERIA GONORRHEA: NEGATIVE

## 2015-07-06 LAB — RPR: RPR Ser Ql: NONREACTIVE

## 2015-07-06 MED ORDER — NITROFURANTOIN MONOHYD MACRO 100 MG PO CAPS
100.0000 mg | ORAL_CAPSULE | Freq: Once | ORAL | Status: AC
  Administered 2015-07-06: 100 mg via ORAL
  Filled 2015-07-06: qty 1

## 2015-07-06 MED ORDER — OXYCODONE-ACETAMINOPHEN 5-325 MG PO TABS: 1.0000 | ORAL_TABLET | ORAL | Status: DC | PRN

## 2015-07-06 MED ORDER — NITROFURANTOIN MONOHYD MACRO 100 MG PO CAPS: 100.0000 mg | ORAL_CAPSULE | Freq: Two times a day (BID) | ORAL | Status: DC

## 2015-07-06 MED ORDER — METRONIDAZOLE 500 MG PO TABS
2000.0000 mg | ORAL_TABLET | Freq: Once | ORAL | Status: AC
  Administered 2015-07-06: 2000 mg via ORAL
  Filled 2015-07-06: qty 4

## 2015-07-06 NOTE — Discharge Instructions (Signed)
Threatened Miscarriage A threatened miscarriage occurs when you have vaginal bleeding during your first 20 weeks of pregnancy but the pregnancy has not ended. If you have vaginal bleeding during this time, your health care provider will do tests to make sure you are still pregnant. If the tests show you are still pregnant and the developing baby (fetus) inside your womb (uterus) is still growing, your condition is considered a threatened miscarriage. A threatened miscarriage does not mean your pregnancy will end, but it does increase the risk of losing your pregnancy (complete miscarriage). CAUSES  The cause of a threatened miscarriage is usually not known. If you go on to have a complete miscarriage, the most common cause is an abnormal number of chromosomes in the developing baby. Chromosomes are the structures inside cells that hold all your genetic material. Some causes of vaginal bleeding that do not result in miscarriage include:  Having sex.  Having an infection.  Normal hormone changes of pregnancy.  Bleeding that occurs when an egg implants in your uterus. RISK FACTORS Risk factors for bleeding in early pregnancy include:  Obesity.  Smoking.  Drinking excessive amounts of alcohol or caffeine.  Recreational drug use. SIGNS AND SYMPTOMS  Light vaginal bleeding.  Mild abdominal pain or cramps. DIAGNOSIS  If you have bleeding with or without abdominal pain before 20 weeks of pregnancy, your health care provider will do tests to check whether you are still pregnant. One important test involves using sound waves and a computer (ultrasound) to create images of the inside of your uterus. Other tests include an internal exam of your vagina and uterus (pelvic exam) and measurement of your baby's heart rate.  You may be diagnosed with a threatened miscarriage if:  Ultrasound testing shows you are still pregnant.  Your baby's heart rate is strong.  A pelvic exam shows that the  opening between your uterus and your vagina (cervix) is closed.  Your heart rate and blood pressure are stable.  Blood tests confirm you are still pregnant. TREATMENT  No treatments have been shown to prevent a threatened miscarriage from going on to a complete miscarriage. However, the right home care is important.  HOME CARE INSTRUCTIONS   Make sure you keep all your appointments for prenatal care. This is very important.  Get plenty of rest.  Do not have sex or use tampons if you have vaginal bleeding.  Do not douche.  Do not smoke or use recreational drugs.  Do not drink alcohol.  Avoid caffeine. SEEK MEDICAL CARE IF:  You have light vaginal bleeding or spotting while pregnant.  You have abdominal pain or cramping.  You have a fever. SEEK IMMEDIATE MEDICAL CARE IF:  You have heavy vaginal bleeding.  You have blood clots coming from your vagina.  You have severe low back pain or abdominal cramps.  You have fever, chills, and severe abdominal pain. MAKE SURE YOU:  Understand these instructions.  Will watch your condition.  Will get help right away if you are not doing well or get worse.   This information is not intended to replace advice given to you by your health care provider. Make sure you discuss any questions you have with your health care provider.   Document Released: 07/14/2005 Document Revised: 07/19/2013 Document Reviewed: 05/10/2013 Elsevier Interactive Patient Education 2016 [employer].  Trichomoniasis Trichomoniasis is an infection caused by an organism called Trichomonas. The infection can affect both women and men. In women, the outer female genitalia and the  vagina are affected. In men, the penis is mainly affected, but the prostate and other reproductive organs can also be involved. Trichomoniasis is a sexually transmitted infection (STI) and is most often passed to another person through sexual contact.  RISK FACTORS  Having unprotected  sexual intercourse.  Having sexual intercourse with an infected partner. SIGNS AND SYMPTOMS  Symptoms of trichomoniasis in women include:  Abnormal gray-green frothy vaginal discharge.  Itching and irritation of the vagina.  Itching and irritation of the area outside the vagina. Symptoms of trichomoniasis in men include:   Penile discharge with or without pain.  Pain during urination. This results from inflammation of the urethra. DIAGNOSIS  Trichomoniasis may be found during a Pap test or physical exam. Your health care provider may use one of the following methods to help diagnose this infection:  Testing the pH of the vagina with a test tape.  Using a vaginal swab test that checks for the Trichomonas organism. A test is available that provides results within a few minutes.  Examining a urine sample.  Testing vaginal secretions. Your health care provider may test you for other STIs, including HIV. TREATMENT   You may be given medicine to fight the infection. Women should inform their health care provider if they could be or are pregnant. Some medicines used to treat the infection should not be taken during pregnancy.  Your health care provider may recommend over-the-counter medicines or creams to decrease itching or irritation.  Your sexual partner will need to be treated if infected.  Your health care provider may test you for infection again 3 months after treatment. HOME CARE INSTRUCTIONS   Take medicines only as directed by your health care provider.  Take over-the-counter medicine for itching or irritation as directed by your health care provider.  Do not have sexual intercourse while you have the infection.  Women should not douche or wear tampons while they have the infection.  Discuss your infection with your partner. Your partner may have gotten the infection from you, or you may have gotten it from your partner.  Have your sex partner get examined and  treated if necessary.  Practice safe, informed, and protected sex.  See your health care provider for other STI testing. SEEK MEDICAL CARE IF:   You still have symptoms after you finish your medicine.  You develop abdominal pain.  You have pain when you urinate.  You have bleeding after sexual intercourse.  You develop a rash.  Your medicine makes you sick or makes you throw up (vomit). MAKE SURE YOU:  Understand these instructions.  Will watch your condition.  Will get help right away if you are not doing well or get worse.   This information is not intended to replace advice given to you by your health care provider. Make sure you discuss any questions you have with your health care provider.   Document Released: 01/07/2001 Document Revised: 08/04/2014 Document Reviewed: 04/25/2013 Elsevier Interactive Patient Education 2016 [employer]  Urinary Tract Infection Urinary tract infections (UTIs) can develop anywhere along your urinary tract. Your urinary tract is your body's drainage system for removing wastes and extra water. Your urinary tract includes two kidneys, two ureters, a bladder, and a urethra. Your kidneys are a pair of bean-shaped organs. Each kidney is about the size of your fist. They are located below your ribs, one on each side of your spine. CAUSES Infections are caused by microbes, which are microscopic organisms, including fungi, viruses, and  bacteria. These organisms are so small that they can only be seen through a microscope. Bacteria are the microbes that most commonly cause UTIs. SYMPTOMS  Symptoms of UTIs may vary by age and gender of the patient and by the location of the infection. Symptoms in young women typically include a frequent and intense urge to urinate and a painful, burning feeling in the bladder or urethra during urination. Older women and men are more likely to be tired, shaky, and weak and have muscle aches and abdominal pain. A fever may  mean the infection is in your kidneys. Other symptoms of a kidney infection include pain in your back or sides below the ribs, nausea, and vomiting. DIAGNOSIS To diagnose a UTI, your caregiver will ask you about your symptoms. Your caregiver will also ask you to provide a urine sample. The urine sample will be tested for bacteria and white blood cells. White blood cells are made by your body to help fight infection. TREATMENT  Typically, UTIs can be treated with medication. Because most UTIs are caused by a bacterial infection, they usually can be treated with the use of antibiotics. The choice of antibiotic and length of treatment depend on your symptoms and the type of bacteria causing your infection. HOME CARE INSTRUCTIONS  If you were prescribed antibiotics, take them exactly as your caregiver instructs you. Finish the medication even if you feel better after you have only taken some of the medication.  Drink enough water and fluids to keep your urine clear or pale yellow.  Avoid caffeine, tea, and carbonated beverages. They tend to irritate your bladder.  Empty your bladder often. Avoid holding urine for long periods of time.  Empty your bladder before and after sexual intercourse.  After a bowel movement, women should cleanse from front to back. Use each tissue only once. SEEK MEDICAL CARE IF:   You have back pain.  You develop a fever.  Your symptoms do not begin to resolve within 3 days. SEEK IMMEDIATE MEDICAL CARE IF:   You have severe back pain or lower abdominal pain.  You develop chills.  You have nausea or vomiting.  You have continued burning or discomfort with urination. MAKE SURE YOU:   Understand these instructions.  Will watch your condition.  Will get help right away if you are not doing well or get worse.   This information is not intended to replace advice given to you by your health care provider. Make sure you discuss any questions you have with your  health care provider.   Document Released: 04/23/2005 Document Revised: 04/04/2015 Document Reviewed: 08/22/2011 Elsevier Interactive Patient Education 2016 Elsevier Inc.  Nitrofurantoin tablets or capsules What is this medicine? NITROFURANTOIN (nye troe fyoor AN toyn) is an antibiotic. It is used to treat urinary tract infections. This medicine may be used for other purposes; ask your health care provider or pharmacist if you have questions. What should I tell my health care provider before I take this medicine? They need to know if you have any of these conditions: -anemia -diabetes -glucose-6-phosphate dehydrogenase deficiency -kidney disease -liver disease -lung disease -other chronic illness -an unusual or allergic reaction to nitrofurantoin, other antibiotics, other medicines, foods, dyes or preservatives -pregnant or trying to get pregnant -breast-feeding How should I use this medicine? Take this medicine by mouth with a glass of water. Follow the directions on the prescription label. Take this medicine with food or milk. Take your doses at regular intervals. Do not take your  medicine more often than directed. Do not stop taking except on your doctor's advice. Talk to your pediatrician regarding the use of this medicine in children. While this drug may be prescribed for selected conditions, precautions do apply. Overdosage: If you think you have taken too much of this medicine contact a poison control center or emergency room at once. NOTE: This medicine is only for you. Do not share this medicine with others. What if I miss a dose? If you miss a dose, take it as soon as you can. If it is almost time for your next dose, take only that dose. Do not take double or extra doses. What may interact with this medicine? -antacids containing magnesium trisilicate -probenecid -quinolone antibiotics like ciprofloxacin, lomefloxacin, norfloxacin and ofloxacin -sulfinpyrazone This list  may not describe all possible interactions. Give your health care provider a list of all the medicines, herbs, non-prescription drugs, or dietary supplements you use. Also tell them if you smoke, drink alcohol, or use illegal drugs. Some items may interact with your medicine. What should I watch for while using this medicine? Tell your doctor or health care professional if your symptoms do not improve or if you get new symptoms. Drink several glasses of water a day. If you are taking this medicine for a long time, visit your doctor for regular checks on your progress. If you are diabetic, you may get a false positive result for sugar in your urine with certain brands of urine tests. Check with your doctor. What side effects may I notice from receiving this medicine? Side effects that you should report to your doctor or health care professional as soon as possible: -allergic reactions like skin rash or hives, swelling of the face, lips, or tongue -chest pain -cough -difficulty breathing -dizziness, drowsiness -fever or infection -joint aches or pains -pale or blue-tinted skin -redness, blistering, peeling or loosening of the skin, including inside the mouth -tingling, burning, pain, or numbness in hands or feet -unusual bleeding or bruising -unusually weak or tired -yellowing of eyes or skin Side effects that usually do not require medical attention (report to your doctor or health care professional if they continue or are bothersome): -dark urine -diarrhea -headache -loss of appetite -nausea or vomiting -temporary hair loss This list may not describe all possible side effects. Call your doctor for medical advice about side effects. You may report side effects to FDA at 1-800-FDA-1088. Where should I keep my medicine? Keep out of the reach of children. Store at room temperature between 15 and 30 degrees C (59 and 86 degrees F). Protect from light. Throw away any unused medicine after the  expiration date. NOTE: This sheet is a summary. It may not cover all possible information. If you have questions about this medicine, talk to your doctor, pharmacist, or health care provider.    2016, Elsevier/Gold Standard. (2008-02-02 15:56:47)  Acetaminophen; Oxycodone tablets What is this medicine? ACETAMINOPHEN; OXYCODONE (a set a MEE noe fen; ox i KOE done) is a pain reliever. It is used to treat moderate to severe pain. This medicine may be used for other purposes; ask your health care provider or pharmacist if you have questions. What should I tell my health care provider before I take this medicine? They need to know if you have any of these conditions: -brain tumor -Crohn's disease, inflammatory bowel disease, or ulcerative colitis -drug abuse or addiction -head injury -heart or circulation problems -if you often drink alcohol -kidney disease or problems going to  the bathroom -liver disease -lung disease, asthma, or breathing problems -an unusual or allergic reaction to acetaminophen, oxycodone, other opioid analgesics, other medicines, foods, dyes, or preservatives -pregnant or trying to get pregnant -breast-feeding How should I use this medicine? Take this medicine by mouth with a full glass of water. Follow the directions on the prescription label. You can take it with or without food. If it upsets your stomach, take it with food. Take your medicine at regular intervals. Do not take it more often than directed. Talk to your pediatrician regarding the use of this medicine in children. Special care may be needed. Patients over 30 years old may have a stronger reaction and need a smaller dose. Overdosage: If you think you have taken too much of this medicine contact a poison control center or emergency room at once. NOTE: This medicine is only for you. Do not share this medicine with others. What if I miss a dose? If you miss a dose, take it as soon as you can. If it is  almost time for your next dose, take only that dose. Do not take double or extra doses. What may interact with this medicine? -alcohol -antihistamines -barbiturates like amobarbital, butalbital, butabarbital, methohexital, pentobarbital, phenobarbital, thiopental, and secobarbital -benztropine -drugs for bladder problems like solifenacin, trospium, oxybutynin, tolterodine, hyoscyamine, and methscopolamine -drugs for breathing problems like ipratropium and tiotropium -drugs for certain stomach or intestine problems like propantheline, homatropine methylbromide, glycopyrrolate, atropine, belladonna, and dicyclomine -general anesthetics like etomidate, ketamine, nitrous oxide, propofol, desflurane, enflurane, halothane, isoflurane, and sevoflurane -medicines for depression, anxiety, or psychotic disturbances -medicines for sleep -muscle relaxants -naltrexone -narcotic medicines (opiates) for pain -phenothiazines like perphenazine, thioridazine, chlorpromazine, mesoridazine, fluphenazine, prochlorperazine, promazine, and trifluoperazine -scopolamine -tramadol -trihexyphenidyl This list may not describe all possible interactions. Give your health care provider a list of all the medicines, herbs, non-prescription drugs, or dietary supplements you use. Also tell them if you smoke, drink alcohol, or use illegal drugs. Some items may interact with your medicine. What should I watch for while using this medicine? Tell your doctor or health care professional if your pain does not go away, if it gets worse, or if you have new or a different type of pain. You may develop tolerance to the medicine. Tolerance means that you will need a higher dose of the medication for pain relief. Tolerance is normal and is expected if you take this medicine for a long time. Do not suddenly stop taking your medicine because you may develop a severe reaction. Your body becomes used to the medicine. This does NOT mean you are  addicted. Addiction is a behavior related to getting and using a drug for a non-medical reason. If you have pain, you have a medical reason to take pain medicine. Your doctor will tell you how much medicine to take. If your doctor wants you to stop the medicine, the dose will be slowly lowered over time to avoid any side effects. You may get drowsy or dizzy. Do not drive, use machinery, or do anything that needs mental alertness until you know how this medicine affects you. Do not stand or sit up quickly, especially if you are an older patient. This reduces the risk of dizzy or fainting spells. Alcohol may interfere with the effect of this medicine. Avoid alcoholic drinks. There are different types of narcotic medicines (opiates) for pain. If you take more than one type at the same time, you may have more side effects. Give your health care  provider a list of all medicines you use. Your doctor will tell you how much medicine to take. Do not take more medicine than directed. Call emergency for help if you have problems breathing. The medicine will cause constipation. Try to have a bowel movement at least every 2 to 3 days. If you do not have a bowel movement for 3 days, call your doctor or health care professional. Do not take Tylenol (acetaminophen) or medicines that have acetaminophen with this medicine. Too much acetaminophen can be very dangerous. Many nonprescription medicines contain acetaminophen. Always read the labels carefully to avoid taking more acetaminophen. What side effects may I notice from receiving this medicine? Side effects that you should report to your doctor or health care professional as soon as possible: -allergic reactions like skin rash, itching or hives, swelling of the face, lips, or tongue -breathing difficulties, wheezing -confusion -light headedness or fainting spells -severe stomach pain -unusually weak or tired -yellowing of the skin or the whites of the eyes Side  effects that usually do not require medical attention (report to your doctor or health care professional if they continue or are bothersome): -dizziness -drowsiness -nausea -vomiting This list may not describe all possible side effects. Call your doctor for medical advice about side effects. You may report side effects to FDA at 1-800-FDA-1088. Where should I keep my medicine? Keep out of the reach of children. This medicine can be abused. Keep your medicine in a safe place to protect it from theft. Do not share this medicine with anyone. Selling or giving away this medicine is dangerous and against the law. This medicine may cause accidental overdose and death if it taken by other adults, children, or pets. Mix any unused medicine with a substance like cat litter or coffee grounds. Then throw the medicine away in a sealed container like a sealed bag or a coffee can with a lid. Do not use the medicine after the expiration date. Store at room temperature between 20 and 25 degrees C (68 and 77 degrees F). NOTE: This sheet is a summary. It may not cover all possible information. If you have questions about this medicine, talk to your doctor, pharmacist, or health care provider.    2016, Elsevier/[employer] Standard. (2014-06-14 15:18:46)

## 2015-07-06 NOTE — ED Notes (Signed)
Pelvic cart at bedside. 

## 2015-07-06 NOTE — ED Notes (Signed)
US at bedside

## 2015-07-09 ENCOUNTER — Other Ambulatory Visit: Payer: Self-pay

## 2015-07-09 DIAGNOSIS — O200 Threatened abortion: Secondary | ICD-10-CM

## 2015-07-10 LAB — HCG, QUANTITATIVE, PREGNANCY: HCG, BETA CHAIN, QUANT, S: 30799.7 m[IU]/mL — AB

## 2015-07-11 ENCOUNTER — Ambulatory Visit (INDEPENDENT_AMBULATORY_CARE_PROVIDER_SITE_OTHER): Payer: Self-pay | Admitting: Family

## 2015-07-11 ENCOUNTER — Encounter: Payer: Self-pay | Admitting: Family

## 2015-07-11 ENCOUNTER — Telehealth: Payer: Self-pay | Admitting: General Practice

## 2015-07-11 VITALS — BP 131/72 | HR 90 | Temp 98.5°F | Wt 227.6 lb

## 2015-07-11 DIAGNOSIS — O021 Missed abortion: Secondary | ICD-10-CM

## 2015-07-11 MED ORDER — MISOPROSTOL 200 MCG PO TABS: ORAL_TABLET | ORAL | Status: DC

## 2015-07-11 MED ORDER — PROMETHAZINE HCL 25 MG PO TABS: 25.0000 mg | ORAL_TABLET | Freq: Four times a day (QID) | ORAL | Status: DC | PRN

## 2015-07-11 NOTE — Telephone Encounter (Signed)
Per Dr Adrian BlackwaterStinson, patient's hcg is still elevated and needs to come in for appt for missed AB. Patient called and left message stating she got her mychart results and doesn't understand them. Called patient and informed her of results and need to come in for appt. Asked patient if she could come tomorrow at 130. Patient states she would prefer today because she has a job [employer] tomorrow [place]. Asked patient if she could come today at 1:30. Patient states she can. Patient had no other questions

## 2015-07-11 NOTE — Progress Notes (Signed)
History   [id number]   Chief Complaint  Patient presents with  . Follow-up    HPI Cynthia Francis is a 19 y.o. female here for follow-up for 7 wk fetal demise.  Upon review of the records patient was first seen in ED on 07/05/15 for abnormal uterine bleeding and cramping.   BHCG on that day was 78295.  Ultrasound showed fetal pole 11mm and no fetal cardiac activity.  GC/CT and wet prep were collected.  Results were GC/CT negx2.  +trich..   Pt discharged home after treatment with flagyl.  Also sent home with treatment for UTI (Macrobid) and percocet for pain.  Pt here today with no report of abdominal pain or vaginal bleeding.   All other systems negative.   Patient's last menstrual period was 05/09/2015.  OB History  No data available    Past Medical History  Diagnosis Date  . Bipolar 2 disorder (HCC)   . Migraine   . Tachycardia   . Depression   . Anxiety     Family History  Problem Relation Age of Onset  . Thyroid disease Mother   . Arthritis Brother   . Hypertension Maternal Grandmother   . Hyperlipidemia Maternal Grandfather     Social History   Social History  . Marital Status: Single    Spouse Name: N/A  . Number of Children: N/A  . Years of Education: N/A   Social History Main Topics  . Smoking status: Current Some Day Smoker -- 0.50 packs/day for 2 years    Types: Cigarettes  . Smokeless tobacco: Never Used  . Alcohol Use: Yes     Comment: social  . Drug Use: No  . Sexual Activity: Yes    Birth Control/ Protection: Injection   Other Topics Concern  . None   Social History Narrative    Allergies  Allergen Reactions  . Imitrex [Sumatriptan]     Nausea, vomiting, tachycardia    Current Outpatient Prescriptions on File Prior to Visit  Medication Sig Dispense Refill  . acetaminophen (TYLENOL) 160 MG chewable tablet Chew 320 mg by mouth every 6 (six) hours as needed for pain. Reported on 07/11/2015    . nitrofurantoin, macrocrystal-monohydrate,  (MACROBID) 100 MG capsule Take 1 capsule (100 mg total) by mouth 2 (two) times daily. X 7 days (Patient not taking: Reported on 07/11/2015) 14 capsule 0  . oxyCODONE-acetaminophen (PERCOCET) 5-325 MG tablet Take 1 tablet by mouth every 4 (four) hours as needed for moderate pain. (Patient not taking: Reported on 07/11/2015) 10 tablet 0  . Prenatal Vit-Fe Fumarate-FA (PRENATAL MULTIVITAMIN) TABS tablet Take 1 tablet by mouth daily at 12 noon. Reported on 07/11/2015     No current facility-administered medications on file prior to visit.     Physical Exam   Filed Vitals:   07/11/15 1329  BP: 131/72  Pulse: 90  Temp: 98.5 F (36.9 C)  Weight: 227 lb 9.6 oz (103.239 kg)    Physical Exam  Constitutional: She is oriented to person, place, and time. She appears well-developed and well-nourished. No distress.  HENT:  Head: Normocephalic.  Neck: Neck supple.  Respiratory: Effort normal and breath sounds normal.  Neurological: She is alert and oriented to person, place, and time. She has normal reflexes.  Skin: Skin is warm and dry.  Psychiatric: She has a normal mood and affect.    MAU Course  Procedures  MDM CBC    Component Value Date/Time   WBC 10.5 07/06/2015 0023  WBC 5.5 03/16/2014 0521   RBC 5.20* 07/06/2015 0023   RBC 4.32 03/16/2014 0521   HGB 15.8* 07/06/2015 0023   HGB 12.3 03/16/2014 0521   HCT 45.9 07/06/2015 0023   HCT 38.7 03/16/2014 0521   PLT 202 07/06/2015 0023   PLT 184 03/16/2014 0521   MCV 88.3 07/06/2015 0023   MCV 90 03/16/2014 0521   MCH 30.4 07/06/2015 0023   MCH 28.5 03/16/2014 0521   MCHC 34.4 07/06/2015 0023   MCHC 31.9* 03/16/2014 0521   RDW 13.2 07/06/2015 0023   RDW 14.2 03/16/2014 0521   LYMPHSABS 2.3 07/06/2015 0023   LYMPHSABS 1.7 03/16/2014 0521   MONOABS 0.7 07/06/2015 0023   MONOABS 0.5 03/16/2014 0521   EOSABS 0.1 07/06/2015 0023   EOSABS 0.0 03/16/2014 0521   BASOSABS 0.0 07/06/2015 0023   BASOSABS 0.0 03/16/2014 0521     Early Intrauterine Pregnancy Failure Protocol X  Documented intrauterine pregnancy failure less than or equal to [redacted] weeks gestation  X  No serious current illness  X  Baseline Hgb greater than or equal to 10g/dl  X  Patient has easily accessible transportation to the hospital  X  Clear preference  X  Practitioner/physician deems patient reliable  X  Counseling by practitioner or physician  X  Patient education by RN    Assessment and Plan  19 y.o. G1P0 at 7 wks - Fetal Demise Expedited Partner Therapy for Trichomoniasis   RX Cytotec 800 mcg Intravaginally by patient at home  Continue Percocet RX Phenergan 12.5 mg by mouth every 4 hours as needed for nausea - prescribed Patient given Expedited Partner Therapy information and informed to give to partner.  Reviewed with pt cytotec procedure.  Pt verbalizes that she lives close to the hospital and has transportation readily available.  Pt appears reliable and verbalizes understanding and agrees with plan of care  Follow-up in two weeks  Marlis EdelsonWalidah N Karim, CNM 07/11/2015 2:10 PM

## 2015-07-11 NOTE — Patient Instructions (Signed)
Expedited Partner Therapy:  °Information Sheet for Patients and Partners  °            ° °You have been offered expedited partner therapy (EPT). This information sheet contains important information and warnings you need to be aware of, so please read it carefully.  ° °Expedited Partner Therapy (EPT) is the clinical practice of treating the sexual partners of persons who receive chlamydia, gonorrhea, or trichomoniasis diagnoses by providing medications or prescriptions to the patient. Patients then provide partners with these therapies without the health-care provider having examined the partner. In other words, EPT is a convenient, fast and private way for patients to help their sexual partners get treated.  ° °Chlamydia and gonorrhea are bacterial infections you get from having sex with a person who is already infected. Trichomoniasis (or “trich”) is a very common sexually transmitted infection (STI) that is caused by infection with a protozoan parasite called Trichomonas vaginalis.  Many people with these infections don’t know it because they feel fine, but without treatment these infections can cause serious health problems, such as pelvic inflammatory disease, ectopic pregnancy, infertility and increased risk of HIV.  ° °It is important to get treated as soon as possible to protect your health, to avoid spreading these infections to others, and to prevent yourself from becoming re-infected. The good news is these infections can be easily cured with proper antibiotic medicine. The best way to take care of your self is to see a doctor or go to your local health department. If you are not able to see a doctor or other medical provider, you should take EPT.  ° ° °Recommended Medication: °EPT for Chlamydia:  Azithromycin (Zithromax) 1 gram orally in a single dose °EPT for Gonorrhea:  Cefixime (Suprax) 400 milligrams orally in a single dose PLUS azithromycin (Zithromax) 1 gram orally in a single dose °EPT for  Trichomoniasis:  Metronidazole (Flagyl) 2 grams orally in a single dose ° ° °These medicines are very safe. However, you should not take them if you have ever had an allergic reaction (like a rash) to any of these medicines: azithromycin (Zithromax), erythromycin, clarithromycin (Biaxin), metronidazole (Flagyl), tinidazole (Tindimax). If you are uncertain about whether you have an allergy, call your medical provider or pharmacist before taking this medicine. If you have a serious, long-term illness like kidney, liver or heart disease, colitis or stomach problems, or you are currently taking other prescription medication, talk to your provider before taking this medication.  ° °Women: If you have lower belly pain, pain during sex, vomiting, or a fever, do not take this medicine. Instead, you should see a medical provider to be certain you do not have pelvic inflammatory disease (PID). PID can be serious and lead to infertility, pregnancy problems or chronic pelvic pain.  ° °Pregnant Women: It is very important for you to see a doctor to get pregnancy services and pre-natal care. These antibiotics for EPT are safe for pregnant women, but you still need to see a medical provider as soon as possible. It is also important to note that Doxycycline is an alternative therapy for chlamydia, but it should not be taken by someone who is pregnant.  ° °Men: If you have pain or swelling in the testicles or a fever, do not take this medicine and see a medical provider.    ° °Men who have sex with men (MSM): MSM in [place] continue to experience high rates of syphilis and HIV. Many MSM with gonorrhea or   chlamydia could also have syphilis and/or HIV and not know it. If you are a man who has sex with other men, it is very important that you see a medical provider and are tested for HIV and syphilis. EPT is not recommended for gonorrhea for MSM.  Recommended treatment for gonorrhea for MSM is Rocephin (shot) AND azithromycin  due to decreased cure rate.  Please see your medical provider if this is the case.   ° °Along with this information sheet is a prescription for the medicine. If you receive a prescription it will be in your name and will indicate your date of birth, or it will be in the name of “Expedited Partner Therapy”.   In either case, you can have the prescription filled at a pharmacy. You will be responsible for the cost of the medicine, unless you have prescription drug coverage. In that case, you could provide your name so the pharmacy could bill your health plan.  ° °Take the medication as directed. Some people will have a mild, upset stomach, which does not last long. AVOID alcohol 24 hours after taking metronidazole (Flagyl) to reduce the possibility of a disulfiram-like reaction (severe vomiting and abdominal pain).  After taking the medicine, do not have sex for 7 days. Do not share this medicine or give it to anyone else. It is important to tell everyone you have had sex with in the last 60 days that they need to go and get tested for sexually transmitted infections.  ° °Ways to prevent these and other sexually transmitted infections (STIs):  ° °• Abstain from sex. This is the only sure way to avoid getting an STI.  °• Use barrier methods, such as condoms, consistently and correctly.  °• Limit the number of sexual partners.  °• Have regular physical exams, including testing for STIs.  ° °For more information about EPT or other issues pertaining to an STI, please contact your medical provider or the [place] [facility] at [phone] or [email or web address]   ° °

## 2015-07-25 ENCOUNTER — Ambulatory Visit (INDEPENDENT_AMBULATORY_CARE_PROVIDER_SITE_OTHER): Payer: Self-pay | Admitting: Family

## 2015-07-25 ENCOUNTER — Encounter: Payer: Self-pay | Admitting: Family

## 2015-07-25 VITALS — BP 128/84 | HR 108 | Temp 98.4°F | Ht 66.0 in | Wt 225.0 lb

## 2015-07-25 DIAGNOSIS — O039 Complete or unspecified spontaneous abortion without complication: Secondary | ICD-10-CM

## 2015-07-25 NOTE — Progress Notes (Signed)
   Subjective:    Patient ID: Laqueta JeanHannah B Gothorp, female    DOB: 05/01/1996, 19 y.o.   MRN: [id number]  HPI Pt is here s/p cytotec placement for failed pregnancy of 7 wks.  Last seen in office on 07/11/15.  Ultrasound showed an 11mm fetal pole with no cardiac activity.  Here today with report of heavy bleeding and cramping night of cytotec placement.  Passed a large clot and pain subsided.  +spotting today with no pain.  Plans to defer pregnancy x one year.  Desires OCPs for family planning.  Denies intercourse in past two weeks.  Past Medical History  Diagnosis Date  . Bipolar 2 disorder (HCC)   . Migraine   . Tachycardia   . Depression   . Anxiety      Review of Systems  Genitourinary: Positive for vaginal bleeding (spotting). Negative for pelvic pain.  All other systems reviewed and are negative.      Objective:   Physical Exam  Constitutional: She is oriented to person, place, and time. She appears well-developed and well-nourished. No distress.  HENT:  Head: Normocephalic and atraumatic.  Neck: Normal range of motion. Neck supple. No thyromegaly present.  Abdominal: Bowel sounds are normal.  Neurological: She is alert and oriented to person, place, and time.  Skin: Skin is warm and dry.       Assessment & Plan:  Post SAB follow-up  BHCG today Will send RX to [facility] if BHCG at prepregnant levels Marlis EdelsonWalidah N Karim, CNM

## 2015-07-26 LAB — HCG, QUANTITATIVE, PREGNANCY: hCG, Beta Chain, Quant, S: 95.6 m[IU]/mL — ABNORMAL HIGH

## 2015-07-29 NOTE — L&D Delivery Note (Addendum)
Delivery Note At 7:51 PM a viable female was delivered via Vaginal, Spontaneous Delivery (Presentation: OA;  LOA).  APGAR: 8, 9; weight: 3810g.   Placenta status: spontaneous, intact.  Cord:  with the following complications: none.  Cord pH: NA  Called to see patient.  Mom pushed to deliver a viable female infant.  The head followed by shoulders, which delivered without difficulty, and the rest of the body.  Nuchal cord noted reduced following birth of baby.  Baby to mom's chest.  Cord clamped and cut after 4 min delay.  No cord blood obtained.  Placenta delivered spontaneously, intact, with a 3-vessel cord.  Second degree perineal laceration repaired with 3-0 Vicryl in standard fashion.  All counts correct.  Hemostasis obtained with IV pitocin and fundal massage.     Anesthesia: epidural   Episiotomy: None Lacerations: 2nd degree Suture Repair: 3.0 vicryl Est. Blood Loss (mL): 250    Mom to postpartum.  Baby to Couplet care / Skin to Skin.  Dajaun Goldring, CNM 06/27/2016, 8:21 PM

## 2015-07-30 ENCOUNTER — Telehealth: Payer: Self-pay | Admitting: Family

## 2015-07-30 NOTE — Telephone Encounter (Signed)
Discussed with patient the HCG of 95 and that's to be expected.  Plan to recheck in two weeks and begin OCPs at that time.

## 2015-10-23 ENCOUNTER — Encounter: Payer: Self-pay | Admitting: Emergency Medicine

## 2015-10-23 ENCOUNTER — Emergency Department
Admission: EM | Admit: 2015-10-23 | Discharge: 2015-10-23 | Disposition: A | Discharge status: Home / Self Care | Payer: Self-pay | Attending: Emergency Medicine | Admitting: Emergency Medicine

## 2015-10-23 DIAGNOSIS — O219 Vomiting of pregnancy, unspecified: Secondary | ICD-10-CM | POA: Insufficient documentation

## 2015-10-23 DIAGNOSIS — Z79899 Other long term (current) drug therapy: Secondary | ICD-10-CM | POA: Insufficient documentation

## 2015-10-23 DIAGNOSIS — O26891 Other specified pregnancy related conditions, first trimester: Secondary | ICD-10-CM | POA: Insufficient documentation

## 2015-10-23 DIAGNOSIS — F3181 Bipolar II disorder: Secondary | ICD-10-CM | POA: Insufficient documentation

## 2015-10-23 DIAGNOSIS — O210 Mild hyperemesis gravidarum: Secondary | ICD-10-CM

## 2015-10-23 DIAGNOSIS — F17210 Nicotine dependence, cigarettes, uncomplicated: Secondary | ICD-10-CM | POA: Insufficient documentation

## 2015-10-23 DIAGNOSIS — Z3491 Encounter for supervision of normal pregnancy, unspecified, first trimester: Secondary | ICD-10-CM

## 2015-10-23 LAB — URINALYSIS COMPLETE WITH MICROSCOPIC (ARMC ONLY)
BACTERIA UA: NONE SEEN
Bilirubin Urine: NEGATIVE
GLUCOSE, UA: NEGATIVE mg/dL
Nitrite: NEGATIVE
PROTEIN: 30 mg/dL — AB
Specific Gravity, Urine: 1.024 (ref 1.005–1.030)
pH: 7 (ref 5.0–8.0)

## 2015-10-23 LAB — POCT PREGNANCY, URINE
PREG TEST UR: NEGATIVE
PREG TEST UR: POSITIVE — AB

## 2015-10-23 MED ORDER — PRENATAL VITAMINS 0.8 MG PO TABS: 1.0000 | ORAL_TABLET | Freq: Every day | ORAL | Status: DC

## 2015-10-23 MED ORDER — METOCLOPRAMIDE HCL 10 MG PO TABS: 10.0000 mg | ORAL_TABLET | Freq: Three times a day (TID) | ORAL | Status: DC | PRN

## 2015-10-23 NOTE — Discharge Instructions (Signed)
First Trimester of Pregnancy °The first trimester of pregnancy is from week 1 until the end of week 12 (months 1 through 3). A week after a sperm fertilizes an egg, the egg will implant on the wall of the uterus. This embryo will begin to develop into a baby. Genes from you and your partner are forming the baby. The female genes determine whether the baby is a boy or a girl. At 6-8 weeks, the eyes and face are formed, and the heartbeat can be seen on ultrasound. At the end of 12 weeks, all the baby's organs are formed.  °Now that you are pregnant, you will want to do everything you can to have a healthy baby. Two of the most important things are to get good prenatal care and to follow your health care provider's instructions. Prenatal care is all the medical care you receive before the baby's birth. This care will help prevent, find, and treat any problems during the pregnancy and childbirth. °BODY CHANGES °Your body goes through many changes during pregnancy. The changes vary from woman to woman.  °· You may gain or lose a couple of pounds at first. °· You may feel sick to your stomach (nauseous) and throw up (vomit). If the vomiting is uncontrollable, call your health care provider. °· You may tire easily. °· You may develop headaches that can be relieved by medicines approved by your health care provider. °· You may urinate more often. Painful urination may mean you have a bladder infection. °· You may develop heartburn as a result of your pregnancy. °· You may develop constipation because certain hormones are causing the muscles that push waste through your intestines to slow down. °· You may develop hemorrhoids or swollen, bulging veins (varicose veins). °· Your breasts may begin to grow larger and become tender. Your nipples may stick out more, and the tissue that surrounds them (areola) may become darker. °· Your gums may bleed and may be sensitive to brushing and flossing. °· Dark spots or blotches (chloasma,  mask of pregnancy) may develop on your face. This will likely fade after the baby is born. °· Your menstrual periods will stop. °· You may have a loss of appetite. °· You may develop cravings for certain kinds of food. °· You may have changes in your emotions from day to day, such as being excited to be pregnant or being concerned that something may go wrong with the pregnancy and baby. °· You may have more vivid and strange dreams. °· You may have changes in your hair. These can include thickening of your hair, rapid growth, and changes in texture. Some women also have hair loss during or after pregnancy, or hair that feels dry or thin. Your hair will most likely return to normal after your baby is born. °WHAT TO EXPECT AT YOUR PRENATAL VISITS °During a routine prenatal visit: °· You will be weighed to make sure you and the baby are growing normally. °· Your blood pressure will be taken. °· Your abdomen will be measured to track your baby's growth. °· The fetal heartbeat will be listened to starting around week 10 or 12 of your pregnancy. °· Test results from any previous visits will be discussed. °Your health care provider may ask you: °· How you are feeling. °· If you are feeling the baby move. °· If you have had any abnormal symptoms, such as leaking fluid, bleeding, severe headaches, or abdominal cramping. °· If you are using any tobacco products,   including cigarettes, chewing tobacco, and electronic cigarettes. °· If you have any questions. °Other tests that may be performed during your first trimester include: °· Blood tests to find your blood type and to check for the presence of any previous infections. They will also be used to check for low iron levels (anemia) and Rh antibodies. Later in the pregnancy, blood tests for diabetes will be done along with other tests if problems develop. °· Urine tests to check for infections, diabetes, or protein in the urine. °· An ultrasound to confirm the proper growth  and development of the baby. °· An amniocentesis to check for possible genetic problems. °· Fetal screens for spina bifida and Down syndrome. °· You may need other tests to make sure you and the baby are doing well. °· HIV (human immunodeficiency virus) testing. Routine prenatal testing includes screening for HIV, unless you choose not to have this test. °HOME CARE INSTRUCTIONS  °Medicines °· Follow your health care provider's instructions regarding medicine use. Specific medicines may be either safe or unsafe to take during pregnancy. °· Take your prenatal vitamins as directed. °· If you develop constipation, try taking a stool softener if your health care provider approves. °Diet °· Eat regular, well-balanced meals. Choose a variety of foods, such as meat or vegetable-based protein, fish, milk and low-fat dairy products, vegetables, fruits, and whole grain breads and cereals. Your health care provider will help you determine the amount of weight gain that is right for you. °· Avoid raw meat and uncooked cheese. These carry germs that can cause birth defects in the baby. °· Eating four or five small meals rather than three large meals a day may help relieve nausea and vomiting. If you start to feel nauseous, eating a few soda crackers can be helpful. Drinking liquids between meals instead of during meals also seems to help nausea and vomiting. °· If you develop constipation, eat more high-fiber foods, such as fresh vegetables or fruit and whole grains. Drink enough fluids to keep your urine clear or pale yellow. °Activity and Exercise °· Exercise only as directed by your health care provider. Exercising will help you: °¨ Control your weight. °¨ Stay in shape. °¨ Be prepared for labor and delivery. °· Experiencing pain or cramping in the lower abdomen or low back is a good sign that you should stop exercising. Check with your health care provider before continuing normal exercises. °· Try to avoid standing for long  periods of time. Move your legs often if you must stand in one place for a long time. °· Avoid heavy lifting. °· Wear low-heeled shoes, and practice good posture. °· You may continue to have sex unless your health care provider directs you otherwise. °Relief of Pain or Discomfort °· Wear a good support bra for breast tenderness.   °· Take warm sitz baths to soothe any pain or discomfort caused by hemorrhoids. Use hemorrhoid cream if your health care provider approves.   °· Rest with your legs elevated if you have leg cramps or low back pain. °· If you develop varicose veins in your legs, wear support hose. Elevate your feet for 15 minutes, 3-4 times a day. Limit salt in your diet. °Prenatal Care °· Schedule your prenatal visits by the twelfth week of pregnancy. They are usually scheduled monthly at first, then more often in the last 2 months before delivery. °· Write down your questions. Take them to your prenatal visits. °· Keep all your prenatal visits as directed by your   health care provider. °Safety °· Wear your seat belt at all times when driving. °· Make a list of emergency phone numbers, including numbers for family, friends, the hospital, and police and fire departments. °General Tips °· Ask your health care provider for a referral to a local prenatal education class. Begin classes no later than at the beginning of month 6 of your pregnancy. °· Ask for help if you have counseling or nutritional needs during pregnancy. Your health care provider can offer advice or refer you to specialists for help with various needs. °· Do not use hot tubs, steam rooms, or saunas. °· Do not douche or use tampons or scented sanitary pads. °· Do not cross your legs for long periods of time. °· Avoid cat litter boxes and soil used by cats. These carry germs that can cause birth defects in the baby and possibly loss of the fetus by miscarriage or stillbirth. °· Avoid all smoking, herbs, alcohol, and medicines not prescribed by  your health care provider. Chemicals in these affect the formation and growth of the baby. °· Do not use any tobacco products, including cigarettes, chewing tobacco, and electronic cigarettes. If you need help quitting, ask your health care provider. You may receive counseling support and other resources to help you quit. °· Schedule a dentist appointment. At home, brush your teeth with a soft toothbrush and be gentle when you floss. °SEEK MEDICAL CARE IF:  °· You have dizziness. °· You have mild pelvic cramps, pelvic pressure, or nagging pain in the abdominal area. °· You have persistent nausea, vomiting, or diarrhea. °· You have a bad smelling vaginal discharge. °· You have pain with urination. °· You notice increased swelling in your face, hands, legs, or ankles. °SEEK IMMEDIATE MEDICAL CARE IF:  °· You have a fever. °· You are leaking fluid from your vagina. °· You have spotting or bleeding from your vagina. °· You have severe abdominal cramping or pain. °· You have rapid weight gain or loss. °· You vomit blood or material that looks like coffee grounds. °· You are exposed to German measles and have never had them. °· You are exposed to fifth disease or chickenpox. °· You develop a severe headache. °· You have shortness of breath. °· You have any kind of trauma, such as from a fall or a car accident. °  °This information is not intended to replace advice given to you by your health care provider. Make sure you discuss any questions you have with your health care provider. °  °Document Released: 07/08/2001 Document Revised: 08/04/2014 Document Reviewed: 05/24/2013 °Elsevier Interactive Patient Education ©2016 [employer] ° ° °Morning Sickness °Morning sickness is when you feel sick to your stomach (nauseous) during pregnancy. This nauseous feeling may or may not come with vomiting. It often occurs in the morning but can be a problem any time of day. Morning sickness is most common during the first trimester,  but it may continue throughout pregnancy. While morning sickness is unpleasant, it is usually harmless unless you develop severe and continual vomiting (hyperemesis gravidarum). This condition requires more intense treatment.  °CAUSES  °The cause of morning sickness is not completely known but seems to be related to normal hormonal changes that occur in pregnancy. °RISK FACTORS °You are at greater risk if you: °· Experienced nausea or vomiting before your pregnancy. °· Had morning sickness during a previous pregnancy. °· Are pregnant with more than one baby, such as twins. °TREATMENT  °Do not use   any medicines (prescription, over-the-counter, or herbal) for morning sickness without first talking to your health care provider. Your health care provider may prescribe or recommend: °· Vitamin B6 supplements. °· Anti-nausea medicines. °· The herbal medicine ginger. °HOME CARE INSTRUCTIONS  °· Only take over-the-counter or prescription medicines as directed by your health care provider. °· Taking multivitamins before getting pregnant can prevent or decrease the severity of morning sickness in most women. °· Eat a piece of dry toast or unsalted crackers before getting out of bed in the morning. °· Eat five or six small meals a day. °· Eat dry and bland foods (rice, baked potato). Foods high in carbohydrates are often helpful. °· Do not drink liquids with your meals. Drink liquids between meals. °· Avoid greasy, fatty, and spicy foods. °· Get someone to cook for you if the smell of any food causes nausea and vomiting. °· If you feel nauseous after taking prenatal vitamins, take the vitamins at night or with a snack.  °· Snack on protein foods (nuts, yogurt, cheese) between meals if you are hungry. °· Eat unsweetened gelatins for desserts. °· Wearing an acupressure wristband (worn for sea sickness) may be helpful. °· Acupuncture may be helpful. °· Do not smoke. °· Get a humidifier to keep the air in your house free of  odors. °· Get plenty of fresh air. °SEEK MEDICAL CARE IF:  °· Your home remedies are not working, and you need medicine. °· You feel dizzy or lightheaded. °· You are losing weight. °SEEK IMMEDIATE MEDICAL CARE IF:  °· You have persistent and uncontrolled nausea and vomiting. °· You pass out (faint). °MAKE SURE YOU: °· Understand these instructions. °· Will watch your condition. °· Will get help right away if you are not doing well or get worse. °  °This information is not intended to replace advice given to you by your health care provider. Make sure you discuss any questions you have with your health care provider. °  °Document Released: 09/04/2006 Document Revised: 07/19/2013 Document Reviewed: 12/29/2012 °Elsevier Interactive Patient Education ©2016 [employer] ° °

## 2015-10-23 NOTE — ED Notes (Addendum)
Patient ambulatory to triage with steady gait, without difficulty or distress noted; pt reports miscarriage in February; st having lower abd cramping since last night; st positive home pregnancy test; denies any bleeding; pt reports unable to obtain urine specimen at this time, but will return to desk when able

## 2015-10-23 NOTE — ED Provider Notes (Signed)
[facility] Emergency Department Provider Note  ____________________________________________  Time seen: 8:40 PM  I have reviewed the triage vital signs and the nursing notes.   HISTORY  Chief Complaint Abdominal Cramping    HPI Cynthia Francis is a 20 y.o. female who complains of lower abdominal cramping for about one day. LMP was 07/10/2016 at which time she had had a miscarriage. Denies any vaginal bleeding chest pain shortness of breath headache or dizziness. Nursing to be. The pelvic pain feels like cramping like a menstrual cycle, also feels like cramping she had with her most recent miscarriage. Denies any other medical problems and takes no medications.     Past Medical History  Diagnosis Date  . Bipolar 2 disorder (HCC)   . Migraine   . Tachycardia   . Depression   . Anxiety      Patient Active Problem List   Diagnosis Date Noted  . Neck pain 01/19/2015  . Low back pain 01/19/2015  . Bipolar 2 disorder (HCC)   . Migraine   . Tachycardia   . Chest pain 12/08/2013  . Rapid heart beat 12/08/2013     Past Surgical History  Procedure Laterality Date  . Cholecystectomy       Current Outpatient Rx  Name  Route  Sig  Dispense  Refill  . acetaminophen (TYLENOL) 160 MG chewable tablet   Oral   Chew 320 mg by mouth every 6 (six) hours as needed for pain. Reported on 07/11/2015         . metoCLOPramide (REGLAN) 10 MG tablet   Oral   Take 1 tablet (10 mg total) by mouth every 8 (eight) hours as needed for nausea.   60 tablet   0   . misoprostol (CYTOTEC) 200 MCG tablet      Place 4 tablets in vagina x one   4 tablet   1   . nitrofurantoin, macrocrystal-monohydrate, (MACROBID) 100 MG capsule   Oral   Take 1 capsule (100 mg total) by mouth 2 (two) times daily. X 7 days Patient not taking: Reported on 07/11/2015   14 capsule   0   . oxyCODONE-acetaminophen (PERCOCET) 5-325 MG tablet   Oral   Take 1 tablet by mouth every 4  (four) hours as needed for moderate pain. Patient not taking: Reported on 07/11/2015   10 tablet   0   . Prenatal Multivit-Min-Fe-FA (PRENATAL VITAMINS) 0.8 MG tablet   Oral   Take 1 tablet by mouth daily.   30 tablet   3   . Prenatal Vit-Fe Fumarate-FA (PRENATAL MULTIVITAMIN) TABS tablet   Oral   Take 1 tablet by mouth daily at 12 noon. Reported on 07/11/2015         . promethazine (PHENERGAN) 25 MG tablet   Oral   Take 1 tablet (25 mg total) by mouth every 6 (six) hours as needed for nausea or vomiting.   15 tablet   0      Allergies Imitrex   Family History  Problem Relation Age of Onset  . Thyroid disease Mother   . Arthritis Brother   . Hypertension Maternal Grandmother   . Hyperlipidemia Maternal Grandfather     Social History Social History  Substance Use Topics  . Smoking status: Current Some Day Smoker -- 0.50 packs/day for 2 years    Types: Cigarettes  . Smokeless tobacco: Never Used  . Alcohol Use: Yes     Comment: social    Review  of Systems  Constitutional:   No fever or chills. No weight changes Eyes:   No vision changes.  ENT:   No sore throat. No rhinorrhea. Cardiovascular:   No chest pain. Respiratory:   No dyspnea or cough. Gastrointestinal:  Pelvic pain without vomiting and diarrhea.  No BRBPR or melena. Genitourinary:   Negative for dysuria or difficulty urinating. Musculoskeletal:   Negative for focal pain or swelling Skin:   Negative for rash. Neurological:   Negative for headaches, focal weakness or numbness.  10-point ROS otherwise negative.  ____________________________________________   PHYSICAL EXAM:  VITAL SIGNS: ED Triage Vitals  Enc Vitals Group     BP 10/23/15 1944 135/91 mmHg     Pulse Rate 10/23/15 1944 109     Resp 10/23/15 1944 18     Temp 10/23/15 1944 98.1 F (36.7 C)     Temp Source 10/23/15 1944 Oral     SpO2 10/23/15 1944 99 %     Weight 10/23/15 1944 220 lb (99.791 kg)     Height 10/23/15 1944 5'  6" (1.676 m)     Head Cir --      Peak Flow --      Pain Score 10/23/15 1945 3     Pain Loc --      Pain Edu? --      Excl. in GC? --     Vital signs reviewed, nursing assessments reviewed.   Constitutional:   Alert and oriented. Well appearing and in no distress. Eyes:   No scleral icterus. No conjunctival pallor. PERRL. EOMI ENT   Head:   Normocephalic and atraumatic.   Nose:   No congestion/rhinnorhea. No septal hematoma   Mouth/Throat:   MMM, no pharyngeal erythema. No peritonsillar mass.    Neck:   No stridor. No SubQ emphysema. No meningismus. Hematological/Lymphatic/Immunilogical:   No cervical lymphadenopathy. Cardiovascular:   RRR heart rate 80. Symmetric bilateral radial and DP pulses.  No murmurs.  Respiratory:   Normal respiratory effort without tachypnea nor retractions. Breath sounds are clear and equal bilaterally. No wheezes/rales/rhonchi. Gastrointestinal:   Soft and nontender. Non distended. There is no CVA tenderness.  No rebound, rigidity, or guarding. Bedside transabdominal ultrasound performed by me to assess the pregnancy shows a small subcentimeter gestational sac within the uterus. 2 small to characterize, no definite IUP, no adnexal masses visualized. The bladder is well distended with urine. Genitourinary:   deferred Musculoskeletal:   Nontender with normal range of motion in all extremities. No joint effusions.  No lower extremity tenderness.  No edema. Neurologic:   Normal speech and language.  CN 2-10 normal. Motor grossly intact. No gross focal neurologic deficits are appreciated.  Skin:    Skin is warm, dry and intact. No rash noted.  No petechiae, purpura, or bullae. Psychiatric:   Mood and affect are normal. ____________________________________________    LABS (pertinent positives/negatives) (all labs ordered are listed, but only abnormal results are displayed) Labs Reviewed  URINALYSIS COMPLETEWITH MICROSCOPIC ([facility] ONLY) -  Abnormal; Notable for the following:    Color, Urine YELLOW (*)    APPearance CLOUDY (*)    Ketones, ur 2+ (*)    Hgb urine dipstick 1+ (*)    Protein, ur 30 (*)    Leukocytes, UA 1+ (*)    Squamous Epithelial / LPF TOO NUMEROUS TO COUNT (*)    All other components within normal limits  POCT PREGNANCY, URINE - Abnormal; Notable for the following:    Preg Test,  Ur POSITIVE (*)    All other components within normal limits  POC URINE PREG, ED  POCT PREGNANCY, URINE   ____________________________________________   EKG    ____________________________________________    RADIOLOGY    ____________________________________________   PROCEDURES   ____________________________________________   INITIAL IMPRESSION / ASSESSMENT AND PLAN / ED COURSE  Pertinent labs & imaging results that were available during my care of the patient were reviewed by me and considered in my medical decision making (see chart for details).  Patient well appearing no acute distress. Presents with pelvic pain in first trimester pregnancy. Also has some symptoms of nausea and food aversion consistent with morning sickness. We'll prescribe antibiotic, encouraged to take prenatal vitamins, will follow-up with the health department tomorrow for initial assessment and application for pregnancy Medicaid. Low suspicion for STI PID TOA torsion ectopic. Return precautions given.     ____________________________________________   FINAL CLINICAL IMPRESSION(S) / ED DIAGNOSES  Final diagnoses:  First trimester pregnancy  Morning sickness      Sharman CheekPhillip Burnetta Kohls, MD 10/23/15 2120

## 2015-12-17 ENCOUNTER — Encounter: Payer: Self-pay | Admitting: Emergency Medicine

## 2015-12-17 ENCOUNTER — Emergency Department: Payer: Medicaid Other

## 2015-12-17 ENCOUNTER — Emergency Department
Admission: EM | Admit: 2015-12-17 | Discharge: 2015-12-17 | Disposition: A | Discharge status: Home / Self Care | Payer: Medicaid Other | Attending: Emergency Medicine | Admitting: Emergency Medicine

## 2015-12-17 DIAGNOSIS — O200 Threatened abortion: Secondary | ICD-10-CM | POA: Diagnosis not present

## 2015-12-17 DIAGNOSIS — Z3A13 13 weeks gestation of pregnancy: Secondary | ICD-10-CM | POA: Diagnosis not present

## 2015-12-17 DIAGNOSIS — Z3492 Encounter for supervision of normal pregnancy, unspecified, second trimester: Secondary | ICD-10-CM

## 2015-12-17 DIAGNOSIS — F3181 Bipolar II disorder: Secondary | ICD-10-CM | POA: Diagnosis not present

## 2015-12-17 DIAGNOSIS — F17210 Nicotine dependence, cigarettes, uncomplicated: Secondary | ICD-10-CM | POA: Diagnosis not present

## 2015-12-17 DIAGNOSIS — Z79899 Other long term (current) drug therapy: Secondary | ICD-10-CM | POA: Insufficient documentation

## 2015-12-17 DIAGNOSIS — R1030 Lower abdominal pain, unspecified: Secondary | ICD-10-CM | POA: Diagnosis present

## 2015-12-17 DIAGNOSIS — A599 Trichomoniasis, unspecified: Secondary | ICD-10-CM

## 2015-12-17 LAB — URINALYSIS COMPLETE WITH MICROSCOPIC (ARMC ONLY)
BILIRUBIN URINE: NEGATIVE
GLUCOSE, UA: NEGATIVE mg/dL
Hgb urine dipstick: NEGATIVE
NITRITE: NEGATIVE
Protein, ur: 30 mg/dL — AB
Specific Gravity, Urine: 1.027 (ref 1.005–1.030)
pH: 5 (ref 5.0–8.0)

## 2015-12-17 LAB — COMPREHENSIVE METABOLIC PANEL
ALT: 14 U/L (ref 14–54)
ANION GAP: 10 (ref 5–15)
AST: 17 U/L (ref 15–41)
Albumin: 3.8 g/dL (ref 3.5–5.0)
Alkaline Phosphatase: 44 U/L (ref 38–126)
BUN: 7 mg/dL (ref 6–20)
CHLORIDE: 106 mmol/L (ref 101–111)
CO2: 21 mmol/L — ABNORMAL LOW (ref 22–32)
CREATININE: 0.53 mg/dL (ref 0.44–1.00)
Calcium: 9.1 mg/dL (ref 8.9–10.3)
Glucose, Bld: 105 mg/dL — ABNORMAL HIGH (ref 65–99)
Potassium: 3.4 mmol/L — ABNORMAL LOW (ref 3.5–5.1)
Sodium: 137 mmol/L (ref 135–145)
Total Bilirubin: 0.3 mg/dL (ref 0.3–1.2)
Total Protein: 7 g/dL (ref 6.5–8.1)

## 2015-12-17 LAB — CBC
HCT: 40.9 % (ref 35.0–47.0)
Hemoglobin: 14 g/dL (ref 12.0–16.0)
MCH: 29.6 pg (ref 26.0–34.0)
MCHC: 34.1 g/dL (ref 32.0–36.0)
MCV: 86.7 fL (ref 80.0–100.0)
PLATELETS: 170 10*3/uL (ref 150–440)
RBC: 4.72 MIL/uL (ref 3.80–5.20)
RDW: 14 % (ref 11.5–14.5)
WBC: 8 10*3/uL (ref 3.6–11.0)

## 2015-12-17 LAB — CHLAMYDIA/NGC RT PCR (ARMC ONLY)
Chlamydia Tr: NOT DETECTED
N gonorrhoeae: NOT DETECTED

## 2015-12-17 LAB — WET PREP, GENITAL
CLUE CELLS WET PREP: NONE SEEN
SPERM: NONE SEEN
Yeast Wet Prep HPF POC: NONE SEEN

## 2015-12-17 LAB — LIPASE, BLOOD: LIPASE: 18 U/L (ref 11–51)

## 2015-12-17 LAB — HCG, QUANTITATIVE, PREGNANCY: HCG, BETA CHAIN, QUANT, S: 35157 m[IU]/mL — AB (ref <5)

## 2015-12-17 MED ORDER — METRONIDAZOLE 500 MG PO TABS
500.0000 mg | ORAL_TABLET | Freq: Once | ORAL | Status: AC
  Administered 2015-12-17: 500 mg via ORAL
  Filled 2015-12-17: qty 1

## 2015-12-17 MED ORDER — METRONIDAZOLE 500 MG PO TABS: 500.0000 mg | ORAL_TABLET | Freq: Two times a day (BID) | ORAL | Status: AC

## 2015-12-17 NOTE — ED Provider Notes (Signed)
[facility] Emergency Department Provider Note  Time seen: 6:45 PM  I have reviewed the triage vital signs and the nursing notes.   HISTORY  Chief Complaint Abdominal Pain and Threatened Miscarriage    HPI Cynthia Francis is a 20 y.o. female G2 P0 A1, who presents the emergency department approximately 10-[redacted] weeks pregnant with lower abdominal pain. According to the patient she has had 1 prior miscarriage at 9 weeks, in which she developed lower abdominal pain similar to today. Patient states for the past 3 days she has been having lower abdominal pain and cramping, had spotting 3 days ago but none since. States vaginal discharge, which is abnormal for her. Denies fever, nausea, vomiting, diarrhea.Describes abdominal pain as a cramping sensation, moderate in severity and fairly constant throughout the day.     Past Medical History  Diagnosis Date  . Bipolar 2 disorder (HCC)   . Migraine   . Tachycardia   . Depression   . Anxiety     Patient Active Problem List   Diagnosis Date Noted  . Neck pain 01/19/2015  . Low back pain 01/19/2015  . Bipolar 2 disorder (HCC)   . Migraine   . Tachycardia   . Chest pain 12/08/2013  . Rapid heart beat 12/08/2013    Past Surgical History  Procedure Laterality Date  . Cholecystectomy      Current Outpatient Rx  Name  Route  Sig  Dispense  Refill  . acetaminophen (TYLENOL) 160 MG chewable tablet   Oral   Chew 320 mg by mouth every 6 (six) hours as needed for pain. Reported on 07/11/2015         . metoCLOPramide (REGLAN) 10 MG tablet   Oral   Take 1 tablet (10 mg total) by mouth every 8 (eight) hours as needed for nausea.   60 tablet   0   . misoprostol (CYTOTEC) 200 MCG tablet      Place 4 tablets in vagina x one   4 tablet   1   . nitrofurantoin, macrocrystal-monohydrate, (MACROBID) 100 MG capsule   Oral   Take 1 capsule (100 mg total) by mouth 2 (two) times daily. X 7 days Patient not taking:  Reported on 07/11/2015   14 capsule   0   . oxyCODONE-acetaminophen (PERCOCET) 5-325 MG tablet   Oral   Take 1 tablet by mouth every 4 (four) hours as needed for moderate pain. Patient not taking: Reported on 07/11/2015   10 tablet   0   . Prenatal Multivit-Min-Fe-FA (PRENATAL VITAMINS) 0.8 MG tablet   Oral   Take 1 tablet by mouth daily.   30 tablet   3   . Prenatal Vit-Fe Fumarate-FA (PRENATAL MULTIVITAMIN) TABS tablet   Oral   Take 1 tablet by mouth daily at 12 noon. Reported on 07/11/2015         . promethazine (PHENERGAN) 25 MG tablet   Oral   Take 1 tablet (25 mg total) by mouth every 6 (six) hours as needed for nausea or vomiting.   15 tablet   0     Allergies Imitrex  Family History  Problem Relation Age of Onset  . Thyroid disease Mother   . Arthritis Brother   . Hypertension Maternal Grandmother   . Hyperlipidemia Maternal Grandfather     Social History Social History  Substance Use Topics  . Smoking status: Current Some Day Smoker -- 0.50 packs/day for 2 years    Types: Cigarettes  .  Smokeless tobacco: Never Used  . Alcohol Use: Yes     Comment: social    Review of Systems Constitutional: Negative for fever. Cardiovascular: Negative for chest pain. Respiratory: Negative for shortness of breath. Gastrointestinal:Positive for lower abdominal cramping. Negative for nausea, vomiting, diarrhea. Genitourinary: Negative for dysuria. Positive for vaginal discharge. Vaginal spotting 3 days ago, none since. Musculoskeletal: Negative for back pain. Neurological: Negative for headache 10-point ROS otherwise negative.  ____________________________________________   PHYSICAL EXAM:  VITAL SIGNS: ED Triage Vitals  Enc Vitals Group     BP 12/17/15 1745 135/94 mmHg     Pulse Rate 12/17/15 1745 130     Resp 12/17/15 1745 18     Temp 12/17/15 1745 98.5 F (36.9 C)     Temp Source 12/17/15 1745 Oral     SpO2 12/17/15 1745 99 %     Weight 12/17/15  1745 225 lb (102.059 kg)     Height 12/17/15 1745  (1.676 m)     Head Cir --      Peak Flow --      Pain Score 12/17/15 1746 6     Pain Loc --      Pain Edu? --      Excl. in GC? --     Constitutional: Alert and oriented. Well appearing and in no distress. Eyes: Normal exam ENT   Head: Normocephalic and atraumatic.   Mouth/Throat: Mucous membranes are moist. Cardiovascular: Normal rate, regular rhythm. No murmur Respiratory: Normal respiratory effort without tachypnea nor retractions. Breath sounds are clear  Gastrointestinal: Soft, mild to moderate suprapubic tenderness palpation, no rebound or guarding. No distention. No upper abdominal pain. Musculoskeletal: Nontender with normal range of motion in all extremities.  Neurologic:  Normal speech and language. No gross focal neurologic deficits  Skin:  Skin is warm, dry and intact.  Psychiatric: Mood and affect are normal.   ____________________________________________     RADIOLOGY  Ultrasound axis with 13 week 1 day pregnancy.  EKG reviewed and interpreted, so shows normal sinus rhythm at 97 bpm, narrow QRS, normal axis, normal intervals, no concerning ST changes identified. ____________________________________________   INITIAL IMPRESSION / ASSESSMENT AND PLAN / ED COURSE  Pertinent labs & imaging results that were available during my care of the patient were reviewed by me and considered in my medical decision making (see chart for details).  Patient presents, unknown pregnancy dates with lower abdominal discomfort, vaginal spotting 3 days ago, and increased vaginal discharge. We will perform a pelvic examination, obtain lab work, and obtain an ultrasound. At this time patient's history is most consistent with a threatened miscarriage.  Pelvic exam shows mild cervical discharge, white in color. Patient does have moderate bilateral adnexal as well as cervical motion tenderness. Does not appear overly tender in  one area versus any other area. I discussed the possibility of STDs with the patient. She states she has been tested recently and it was negative and she has not been with any other partners besides her current partner. She does not feel that she is at risk for STDs. We will await for test results to return. Currently awaiting ultrasound.  Ultrasound axis with 13 week 1 day pregnancy. Wet prep consistent with trichomoniasis. We will place the patient on Flagyl twice a day for 7 days have her follow-up with the GYN. Patient agreeable.  ____________________________________________   FINAL CLINICAL IMPRESSION(S) / ED DIAGNOSES  Threatened miscarriage Abdominal pain Trichomoniasis  Minna Antis, MD 12/17/15 2117

## 2015-12-17 NOTE — ED Notes (Signed)
Pt presents to ED with known pregnancy. Pt states she has severe cramping x3 days " all my pregnancy symptoms have stopped". Pt miscarried in January and these s/s happened before the miscarriage.

## 2015-12-17 NOTE — ED Notes (Signed)

## 2015-12-17 NOTE — ED Notes (Addendum)
Pt presents with lower abdominal pain x 3 days. Pt reports small amount of bleeding a few days ago, but it has stopped. Pt reports cramping that is intermittent today. Pt has hx of one miscarriage in January. Pt approximately 3 months pregnant. Pt alert & oriented with NAD noted.

## 2015-12-17 NOTE — ED Notes (Signed)
Patient transported to Ultrasound via stretcher 

## 2015-12-17 NOTE — Discharge Instructions (Signed)
Second Trimester of Pregnancy The second trimester is from week 13 through week 28, months 4 through 6. The second trimester is often a time when you feel your best. Your body has also adjusted to being pregnant, and you begin to feel better physically. Usually, morning sickness has lessened or quit completely, you may have more energy, and you may have an increase in appetite. The second trimester is also a time when the fetus is growing rapidly. At the end of the sixth month, the fetus is about 9 inches long and weighs about 1 pounds. You will likely begin to feel the baby move (quickening) between 18 and 20 weeks of the pregnancy. BODY CHANGES Your body goes through many changes during pregnancy. The changes vary from woman to woman.   Your weight will continue to increase. You will notice your lower abdomen bulging out.  You may begin to get stretch marks on your hips, abdomen, and breasts.  You may develop headaches that can be relieved by medicines approved by your health care provider.  You may urinate more often because the fetus is pressing on your bladder.  You may develop or continue to have heartburn as a result of your pregnancy.  You may develop constipation because certain hormones are causing the muscles that push waste through your intestines to slow down.  You may develop hemorrhoids or swollen, bulging veins (varicose veins).  You may have back pain because of the weight gain and pregnancy hormones relaxing your joints between the bones in your pelvis and as a result of a shift in weight and the muscles that support your balance.  Your breasts will continue to grow and be tender.  Your gums may bleed and may be sensitive to brushing and flossing.  Dark spots or blotches (chloasma, mask of pregnancy) may develop on your face. This will likely fade after the baby is born.  A dark line from your belly button to the pubic area (linea nigra) may appear. This will likely fade  after the baby is born.  You may have changes in your hair. These can include thickening of your hair, rapid growth, and changes in texture. Some women also have hair loss during or after pregnancy, or hair that feels dry or thin. Your hair will most likely return to normal after your baby is born. WHAT TO EXPECT AT YOUR PRENATAL VISITS During a routine prenatal visit:  You will be weighed to make sure you and the fetus are growing normally.  Your blood pressure will be taken.  Your abdomen will be measured to track your baby's growth.  The fetal heartbeat will be listened to.  Any test results from the previous visit will be discussed. Your health care provider may ask you:  How you are feeling.  If you are feeling the baby move.  If you have had any abnormal symptoms, such as leaking fluid, bleeding, severe headaches, or abdominal cramping.  If you are using any tobacco products, including cigarettes, chewing tobacco, and electronic cigarettes.  If you have any questions. Other tests that may be performed during your second trimester include:  Blood tests that check for:  Low iron levels (anemia).  Gestational diabetes (between 24 and 28 weeks).  Rh antibodies.  Urine tests to check for infections, diabetes, or protein in the urine.  An ultrasound to confirm the proper growth and development of the baby.  An amniocentesis to check for possible genetic problems.  Fetal screens for spina bifida   and Down syndrome.  HIV (human immunodeficiency virus) testing. Routine prenatal testing includes screening for HIV, unless you choose not to have this test. HOME CARE INSTRUCTIONS   Avoid all smoking, herbs, alcohol, and unprescribed drugs. These chemicals affect the formation and growth of the baby.  Do not use any tobacco products, including cigarettes, chewing tobacco, and electronic cigarettes. If you need help quitting, ask your health care provider. You may receive  counseling support and other resources to help you quit.  Follow your health care provider's instructions regarding medicine use. There are medicines that are either safe or unsafe to take during pregnancy.  Exercise only as directed by your health care provider. Experiencing uterine cramps is a good sign to stop exercising.  Continue to eat regular, healthy meals.  Wear a good support bra for breast tenderness.  Do not use hot tubs, steam rooms, or saunas.  Wear your seat belt at all times when driving.  Avoid raw meat, uncooked cheese, cat litter boxes, and soil used by cats. These carry germs that can cause birth defects in the baby.  Take your prenatal vitamins.  Take 1500-2000 mg of calcium daily starting at the 20th week of pregnancy until you deliver your baby.  Try taking a stool softener (if your health care provider approves) if you develop constipation. Eat more high-fiber foods, such as fresh vegetables or fruit and whole grains. Drink plenty of fluids to keep your urine clear or pale yellow.  Take warm sitz baths to soothe any pain or discomfort caused by hemorrhoids. Use hemorrhoid cream if your health care provider approves.  If you develop varicose veins, wear support hose. Elevate your feet for 15 minutes, 3-4 times a day. Limit salt in your diet.  Avoid heavy lifting, wear low heel shoes, and practice good posture.  Rest with your legs elevated if you have leg cramps or low back pain.  Visit your dentist if you have not gone yet during your pregnancy. Use a soft toothbrush to brush your teeth and be gentle when you floss.  A sexual relationship may be continued unless your health care provider directs you otherwise.  Continue to go to all your prenatal visits as directed by your health care provider. SEEK MEDICAL CARE IF:   You have dizziness.  You have mild pelvic cramps, pelvic pressure, or nagging pain in the abdominal area.  You have persistent nausea,  vomiting, or diarrhea.  You have a bad smelling vaginal discharge.  You have pain with urination. SEEK IMMEDIATE MEDICAL CARE IF:   You have a fever.  You are leaking fluid from your vagina.  You have spotting or bleeding from your vagina.  You have severe abdominal cramping or pain.  You have rapid weight gain or loss.  You have shortness of breath with chest pain.  You notice sudden or extreme swelling of your face, hands, ankles, feet, or legs.  You have not felt your baby move in over an hour.  You have severe headaches that do not go away with medicine.  You have vision changes.   This information is not intended to replace advice given to you by your health care provider. Make sure you discuss any questions you have with your health care provider.   Document Released: 07/08/2001 Document Revised: 08/04/2014 Document Reviewed: 09/14/2012 Elsevier Interactive Patient Education 2016 [employer]  Trichomoniasis Trichomoniasis is an infection caused by an organism called Trichomonas. The infection can affect both women and men. In women,   the outer female genitalia and the vagina are affected. In men, the penis is mainly affected, but the prostate and other reproductive organs can also be involved. Trichomoniasis is a sexually transmitted infection (STI) and is most often passed to another person through sexual contact.  RISK FACTORS  Having unprotected sexual intercourse.  Having sexual intercourse with an infected partner. SIGNS AND SYMPTOMS  Symptoms of trichomoniasis in women include:  Abnormal gray-green frothy vaginal discharge.  Itching and irritation of the vagina.  Itching and irritation of the area outside the vagina. Symptoms of trichomoniasis in men include:   Penile discharge with or without pain.  Pain during urination. This results from inflammation of the urethra. DIAGNOSIS  Trichomoniasis may be found during a Pap test or physical exam. Your  health care provider may use one of the following methods to help diagnose this infection:  Testing the pH of the vagina with a test tape.  Using a vaginal swab test that checks for the Trichomonas organism. A test is available that provides results within a few minutes.  Examining a urine sample.  Testing vaginal secretions. Your health care provider may test you for other STIs, including HIV. TREATMENT   You may be given medicine to fight the infection. Women should inform their health care provider if they could be or are pregnant. Some medicines used to treat the infection should not be taken during pregnancy.  Your health care provider may recommend over-the-counter medicines or creams to decrease itching or irritation.  Your sexual partner will need to be treated if infected.  Your health care provider may test you for infection again 3 months after treatment. HOME CARE INSTRUCTIONS   Take medicines only as directed by your health care provider.  Take over-the-counter medicine for itching or irritation as directed by your health care provider.  Do not have sexual intercourse while you have the infection.  Women should not douche or wear tampons while they have the infection.  Discuss your infection with your partner. Your partner may have gotten the infection from you, or you may have gotten it from your partner.  Have your sex partner get examined and treated if necessary.  Practice safe, informed, and protected sex.  See your health care provider for other STI testing. SEEK MEDICAL CARE IF:   You still have symptoms after you finish your medicine.  You develop abdominal pain.  You have pain when you urinate.  You have bleeding after sexual intercourse.  You develop a rash.  Your medicine makes you sick or makes you throw up (vomit). MAKE SURE YOU:  Understand these instructions.  Will watch your condition.  Will get help right away if you are not doing  well or get worse.   This information is not intended to replace advice given to you by your health care provider. Make sure you discuss any questions you have with your health care provider.   Document Released: 01/07/2001 Document Revised: 08/04/2014 Document Reviewed: 04/25/2013 Elsevier Interactive Patient Education 2016 [employer] Inc[employer]  

## 2016-04-09 ENCOUNTER — Observation Stay
Admission: EM | Admit: 2016-04-09 | Discharge: 2016-04-10 | Disposition: A | Discharge status: Home / Self Care | Payer: Medicaid Other | Attending: Obstetrics and Gynecology | Admitting: Obstetrics and Gynecology

## 2016-04-09 DIAGNOSIS — O26899 Other specified pregnancy related conditions, unspecified trimester: Secondary | ICD-10-CM

## 2016-04-09 DIAGNOSIS — R319 Hematuria, unspecified: Secondary | ICD-10-CM | POA: Insufficient documentation

## 2016-04-09 DIAGNOSIS — R1011 Right upper quadrant pain: Secondary | ICD-10-CM

## 2016-04-09 DIAGNOSIS — O26893 Other specified pregnancy related conditions, third trimester: Principal | ICD-10-CM | POA: Insufficient documentation

## 2016-04-09 DIAGNOSIS — Z3A29 29 weeks gestation of pregnancy: Secondary | ICD-10-CM | POA: Insufficient documentation

## 2016-04-09 LAB — URINE DRUG SCREEN, QUALITATIVE (ARMC ONLY)
Amphetamines, Ur Screen: NOT DETECTED
BARBITURATES, UR SCREEN: NOT DETECTED
BENZODIAZEPINE, UR SCRN: NOT DETECTED
CANNABINOID 50 NG, UR ~~LOC~~: NOT DETECTED
COCAINE METABOLITE, UR ~~LOC~~: NOT DETECTED
MDMA (Ecstasy)Ur Screen: NOT DETECTED
Methadone Scn, Ur: NOT DETECTED
OPIATE, UR SCREEN: NOT DETECTED
PHENCYCLIDINE (PCP) UR S: NOT DETECTED
Tricyclic, Ur Screen: NOT DETECTED

## 2016-04-09 LAB — WET PREP, GENITAL
CLUE CELLS WET PREP: NONE SEEN
Sperm: NONE SEEN
YEAST WET PREP: NONE SEEN

## 2016-04-09 LAB — URINALYSIS COMPLETE WITH MICROSCOPIC (ARMC ONLY)
Bilirubin Urine: NEGATIVE
Glucose, UA: NEGATIVE mg/dL
Ketones, ur: NEGATIVE mg/dL
Nitrite: NEGATIVE
PH: 7 (ref 5.0–8.0)
PROTEIN: NEGATIVE mg/dL
Specific Gravity, Urine: 1.005 (ref 1.005–1.030)

## 2016-04-09 NOTE — OB Triage Note (Signed)
Patient came in with complaints of pain/pressure in upper right abdomen and lower right abdomen. Pressure comes in goes. Pressure began early this morning, but has been going on for a few days. Today patient came in concern because of urine in her blood at 20:45. Per patient after this incidence, "had the urge to pee and all I saw was nothing but blood". Patient reports decrease fetal movement. I applied UA and toco monitors on patient, fetal baseline HR 145. I asked patient to urinate in a cup, no blood noted.

## 2016-04-09 NOTE — OB Triage Provider Note (Signed)
History     CSN: [id number]  Arrival date and time: 04/09/16 2113   None     Chief Complaint  Patient presents with  . Abdominal Pain   HPI Cynthia Francis is a 20 yo G2P0 at 29+3 weeks by unsure LMP of 09/11/15, dated by 13 week [place] on 12/17/15, with EDD of 06/22/16.  She has had prenatal care at [facility], but has not been seen in several weeks, however she states she was trying to establish care with [facility] or [facility].  She reports bright red bleeding in the toilet after urinating today and bleeding when she wiped.  She states she put on a pad and there was some blood/pink tinged on the pad.  She denies recent intercourse in months.  She also states she hasn't felt her baby move as much today and "feels some pressure."  She denies LOF, dysuria, urgency, frequency.  She denies pain.  She does report an abnormal vaginal discharge, +vaginal odor, +vaginal irritation.    Significant OB hx: non-compliance with PNC, obesity, trichomonas x 2 - non compliant with medications, UTI, bipolar and depression - not on medications, tobacco use - but has quit, tachycardia with normal TSH  Blood Type: A Positive   [place] at [facility] on 02/13/16 at 21 weeks:  Anterior placenta  Perform a fetal growth ultrasound at 30-32 gestational weeks.  Initiate weekly antenatal fetal testing with nonstress tests or  biophysical profile starting at 32-34 gestational weeks OB History    Gravida Para Term Preterm AB Living   2       1     SAB TAB Ectopic Multiple Live Births                  Past Medical History:  Diagnosis Date  . Anxiety   . Bipolar 2 disorder (HCC)   . Depression   . Migraine   . Tachycardia     Past Surgical History:  Procedure Laterality Date  . CHOLECYSTECTOMY      Family History  Problem Relation Age of Onset  . Thyroid disease Mother   . Arthritis Brother   . Hypertension Maternal Grandmother   . Hyperlipidemia Maternal Grandfather     Social History  Substance Use Topics  . Smoking  status: Former Smoker    Packs/day: 0.50    Years: 2.00    Types: Cigarettes  . Smokeless tobacco: Never Used  . Alcohol use No     Comment: social    Allergies:  Allergies  Allergen Reactions  . Imitrex [Sumatriptan]     Nausea, vomiting, tachycardia    Prescriptions Prior to Admission  Medication Sig Dispense Refill Last Dose  . aspirin 81 MG chewable tablet Chew 81 mg by mouth daily.     Marland Kitchen acetaminophen (TYLENOL) 160 MG chewable tablet Chew 320 mg by mouth every 6 (six) hours as needed for pain. Reported on 07/11/2015   Not Taking at Unknown time  . metoCLOPramide (REGLAN) 10 MG tablet Take 1 tablet (10 mg total) by mouth every 8 (eight) hours as needed for nausea. (Patient not taking: Reported on 04/09/2016) 60 tablet 0 Not Taking at Unknown time  . misoprostol (CYTOTEC) 200 MCG tablet Place 4 tablets in vagina x one (Patient not taking: Reported on 04/09/2016) 4 tablet 1 Not Taking at Unknown time  . nitrofurantoin, macrocrystal-monohydrate, (MACROBID) 100 MG capsule Take 1 capsule (100 mg total) by mouth 2 (two) times daily. X 7 days (Patient not taking: Reported on  04/09/2016) 14 capsule 0 Not Taking at Unknown time  . oxyCODONE-acetaminophen (PERCOCET) 5-325 MG tablet Take 1 tablet by mouth every 4 (four) hours as needed for moderate pain. (Patient not taking: Reported on 04/09/2016) 10 tablet 0 Not Taking at Unknown time  . Prenatal Multivit-Min-Fe-FA (PRENATAL VITAMINS) 0.8 MG tablet Take 1 tablet by mouth daily. (Patient not taking: Reported on 04/09/2016) 30 tablet 3 Not Taking at Unknown time  . Prenatal Vit-Fe Fumarate-FA (PRENATAL MULTIVITAMIN) TABS tablet Take 1 tablet by mouth daily at 12 noon. Reported on 07/11/2015   Not Taking at Unknown time  . promethazine (PHENERGAN) 25 MG tablet Take 1 tablet (25 mg total) by mouth every 6 (six) hours as needed for nausea or vomiting. (Patient not taking: Reported on 04/09/2016) 15 tablet 0 Not Taking at Unknown time    Review of  Systems  Constitutional: Negative.   Respiratory: Negative for shortness of breath.   Cardiovascular: Negative for chest pain and palpitations.       Pt. States she always has tachycardia and was cleared in 2015 by cardiology   Gastrointestinal: Negative for heartburn, nausea and vomiting.       Occ BH ctxs  Genitourinary: Positive for hematuria. Negative for dysuria, frequency and urgency.  Musculoskeletal: Negative for back pain.  Neurological: Negative.  Negative for headaches.  Endo/Heme/Allergies: Negative.   Psychiatric/Behavioral: Negative.    Physical Exam   Blood pressure 128/73, temperature 98.2 F (36.8 C), temperature source Oral, resp. rate 18, last menstrual period 09/07/2015.  Physical Exam  Constitutional: She is oriented to person, place, and time. She appears well-developed and well-nourished.  Cardiovascular: Normal rate and regular rhythm.   Tachycardia   Respiratory: Effort normal and breath sounds normal.  GI: Soft.  Genitourinary:  Genitourinary Comments: Uterus: gravid, non-tender Sterile Speculum Exam: copious yellow-green, frothy discharge noted in vaginal vault.  No source of bleeding noted in vagina or cervix SVE: closed/thick  NO Blood noted on glove or speculum after exam   Musculoskeletal: She exhibits no edema.  No CVAT bilaterally   Neurological: She is alert and oriented to person, place, and time.  Skin: Skin is warm and dry.  Fetal monitoring: Baseline: 145 bpm/ Moderate variability/ +accels/ occasional variable Toco: UI with occasional ctx    Procedures Results for orders placed or performed during the hospital encounter of 04/09/16 (from the past 24 hour(s))  Chlamydia/NGC rt PCR ([facility] only)     Status: None   Collection Time: 04/09/16 10:30 PM  Result Value Ref Range   Specimen source GC/Chlam URINE, RANDOM    Chlamydia Tr NOT DETECTED NOT DETECTED   N gonorrhoeae NOT DETECTED NOT DETECTED  Urine Drug Screen, Qualitative ([facility]  only)     Status: None   Collection Time: 04/09/16 10:30 PM  Result Value Ref Range   Tricyclic, Ur Screen NONE DETECTED NONE DETECTED   Amphetamines, Ur Screen NONE DETECTED NONE DETECTED   MDMA (Ecstasy)Ur Screen NONE DETECTED NONE DETECTED   Cocaine Metabolite,Ur [place] NONE DETECTED NONE DETECTED   Opiate, Ur Screen NONE DETECTED NONE DETECTED   Phencyclidine (PCP) Ur S NONE DETECTED NONE DETECTED   Cannabinoid 50 Ng, Ur [place] NONE DETECTED NONE DETECTED   Barbiturates, Ur Screen NONE DETECTED NONE DETECTED   Benzodiazepine, Ur Scrn NONE DETECTED NONE DETECTED   Methadone Scn, Ur NONE DETECTED NONE DETECTED  Urinalysis complete, with microscopic ([facility] only)     Status: Abnormal   Collection Time: 04/09/16 10:37 PM  Result Value Ref  Range   Color, Urine STRAW (A) YELLOW   APPearance CLEAR (A) CLEAR   Glucose, UA NEGATIVE NEGATIVE mg/dL   Bilirubin Urine NEGATIVE NEGATIVE   Ketones, ur NEGATIVE NEGATIVE mg/dL   Specific Gravity, Urine 1.005 1.005 - 1.030   Hgb urine dipstick 1+ (A) NEGATIVE   pH 7.0 5.0 - 8.0   Protein, ur NEGATIVE NEGATIVE mg/dL   Nitrite NEGATIVE NEGATIVE   Leukocytes, UA 3+ (A) NEGATIVE   RBC / HPF 0-5 0 - 5 RBC/hpf   WBC, UA 6-30 0 - 5 WBC/hpf   Bacteria, UA RARE (A) NONE SEEN   Squamous Epithelial / LPF 0-5 (A) NONE SEEN   Mucous PRESENT   Wet prep, genital     Status: Abnormal   Collection Time: 04/09/16 10:37 PM  Result Value Ref Range   Yeast Wet Prep HPF POC NONE SEEN NONE SEEN   Trich, Wet Prep PRESENT (A) NONE SEEN   Clue Cells Wet Prep HPF POC NONE SEEN NONE SEEN   WBC, Wet Prep HPF POC TOO NUMEROUS TO COUNT (A) NONE SEEN   Sperm NONE SEEN    Assessment and Plan  1. IUP at 29+3 weeks      - Limited prenatal care - UDS pending 2. Hematuria vs. Vaginal bleeding in 3rd trimester     - UA/UC      - Wet Prep     - GC/CT - negative     - Continuous fetal monitoring  3. Trichomonas - Flagyl 2 grams x 1, dispense 4 tablets, given while in hospital 4.  No evidence of vaginal bleeding  5. Hematuria and Leukocytes present  6. Fetal monitoring reactive and reassuring/appropriate for gestational age  4. . D/C Home     - Hematuria likely related to a UTI based on clinical picture- Macrobid 100mg  BID x 7 days - will call with urine culture results    - FKC's daily - handout given     - Strict precautions and warning s/s reviewed    - F/U for worsening s/s     - Needs to establish prenatal care - due for 1 hour GTT, repeat growth [place], and NSTs starting at 32 weeks       Dr. Dalbert Garnet aware and agrees with plan   Karena Addison 04/09/2016, 10:10 PM

## 2016-04-10 DIAGNOSIS — O26893 Other specified pregnancy related conditions, third trimester: Secondary | ICD-10-CM | POA: Diagnosis not present

## 2016-04-10 LAB — CHLAMYDIA/NGC RT PCR (ARMC ONLY)
CHLAMYDIA TR: NOT DETECTED
N GONORRHOEAE: NOT DETECTED

## 2016-04-10 MED ORDER — NITROFURANTOIN MONOHYD MACRO 100 MG PO CAPS: 100.0000 mg | ORAL_CAPSULE | Freq: Two times a day (BID) | ORAL | 0 refills | Status: DC

## 2016-04-10 MED ORDER — METRONIDAZOLE 500 MG PO TABS
2000.0000 mg | ORAL_TABLET | Freq: Once | ORAL | Status: AC
  Administered 2016-04-10: 2000 mg via ORAL
  Filled 2016-04-10: qty 4

## 2016-04-10 NOTE — Final Progress Note (Signed)
History     CSN: [id number]  Arrival date and time: 04/09/16 2113   None        Chief Complaint  Patient presents with  . Abdominal Pain   HPI Cynthia Francis is a 20 yo G2P0 at 29+3 weeks by unsure LMP of 09/11/15, dated by 13 week [place] on 12/17/15, with EDD of 06/22/16.  She has had prenatal care at [facility], but has not been seen in several weeks, however she states she was trying to establish care with [facility] or [facility].  She reports bright red bleeding in the toilet after urinating today and bleeding when she wiped.  She states she put on a pad and there was some blood/pink tinged on the pad.  She denies recent intercourse in months.  She also states she hasn't felt her baby move as much today and "feels some pressure."  She denies LOF, dysuria, urgency, frequency.  She denies pain.  She does report an abnormal vaginal discharge, +vaginal odor, +vaginal irritation.    Significant OB hx: non-compliance with PNC, obesity, trichomonas x 2 - non compliant with medications, UTI, bipolar and depression - not on medications, tobacco use - but has quit, tachycardia with normal TSH  Blood Type: A Positive   [place] at [facility] on 02/13/16 at 21 weeks:  Anterior placenta  Perform a fetal growth ultrasound at 30-32 gestational weeks.  Initiate weekly antenatal fetal testing with nonstress tests or  biophysical profile starting at 32-34 gestational weeks         OB History    Gravida Para Term Preterm AB Living   2       1     SAB TAB Ectopic Multiple Live Births                      Past Medical History:  Diagnosis Date  . Anxiety   . Bipolar 2 disorder (HCC)   . Depression   . Migraine   . Tachycardia          Past Surgical History:  Procedure Laterality Date  . CHOLECYSTECTOMY           Family History  Problem Relation Age of Onset  . Thyroid disease Mother   . Arthritis Brother   . Hypertension Maternal Grandmother   . Hyperlipidemia Maternal Grandfather            Social History  Substance Use Topics  . Smoking status: Former Smoker    Packs/day: 0.50    Years: 2.00    Types: Cigarettes  . Smokeless tobacco: Never Used  . Alcohol use No     Comment: social    Allergies:       Allergies  Allergen Reactions  . Imitrex [Sumatriptan]     Nausea, vomiting, tachycardia           Prescriptions Prior to Admission  Medication Sig Dispense Refill Last Dose  . aspirin 81 MG chewable tablet Chew 81 mg by mouth daily.     Marland Kitchen acetaminophen (TYLENOL) 160 MG chewable tablet Chew 320 mg by mouth every 6 (six) hours as needed for pain. Reported on 07/11/2015   Not Taking at Unknown time  . metoCLOPramide (REGLAN) 10 MG tablet Take 1 tablet (10 mg total) by mouth every 8 (eight) hours as needed for nausea. (Patient not taking: Reported on 04/09/2016) 60 tablet 0 Not Taking at Unknown time  . misoprostol (CYTOTEC) 200 MCG tablet Place 4 tablets in vagina x  one (Patient not taking: Reported on 04/09/2016) 4 tablet 1 Not Taking at Unknown time  . nitrofurantoin, macrocrystal-monohydrate, (MACROBID) 100 MG capsule Take 1 capsule (100 mg total) by mouth 2 (two) times daily. X 7 days (Patient not taking: Reported on 04/09/2016) 14 capsule 0 Not Taking at Unknown time  . oxyCODONE-acetaminophen (PERCOCET) 5-325 MG tablet Take 1 tablet by mouth every 4 (four) hours as needed for moderate pain. (Patient not taking: Reported on 04/09/2016) 10 tablet 0 Not Taking at Unknown time  . Prenatal Multivit-Min-Fe-FA (PRENATAL VITAMINS) 0.8 MG tablet Take 1 tablet by mouth daily. (Patient not taking: Reported on 04/09/2016) 30 tablet 3 Not Taking at Unknown time  . Prenatal Vit-Fe Fumarate-FA (PRENATAL MULTIVITAMIN) TABS tablet Take 1 tablet by mouth daily at 12 noon. Reported on 07/11/2015   Not Taking at Unknown time  . promethazine (PHENERGAN) 25 MG tablet Take 1 tablet (25 mg total) by mouth every 6 (six) hours as needed for nausea or vomiting.  (Patient not taking: Reported on 04/09/2016) 15 tablet 0 Not Taking at Unknown time    Review of Systems  Constitutional: Negative.   Respiratory: Negative for shortness of breath.   Cardiovascular: Negative for chest pain and palpitations.       Pt. States she always has tachycardia and was cleared in 2015 by cardiology   Gastrointestinal: Negative for heartburn, nausea and vomiting.       Occ BH ctxs  Genitourinary: Positive for hematuria. Negative for dysuria, frequency and urgency.  Musculoskeletal: Negative for back pain.  Neurological: Negative.  Negative for headaches.  Endo/Heme/Allergies: Negative.   Psychiatric/Behavioral: Negative.    Physical Exam   Blood pressure 128/73, temperature 98.2 F (36.8 C), temperature source Oral, resp. rate 18, last menstrual period 09/07/2015.  Physical Exam  Constitutional: She is oriented to person, place, and time. She appears well-developed and well-nourished.  Cardiovascular: Normal rate and regular rhythm.   Tachycardia   Respiratory: Effort normal and breath sounds normal.  GI: Soft.  Genitourinary:  Genitourinary Comments: Uterus: gravid, non-tender Sterile Speculum Exam: copious yellow-green, frothy discharge noted in vaginal vault.  No source of bleeding noted in vagina or cervix SVE: closed/thick  NO Blood noted on glove or speculum after exam   Musculoskeletal: She exhibits no edema.  No CVAT bilaterally   Neurological: She is alert and oriented to person, place, and time.  Skin: Skin is warm and dry.  Fetal monitoring: Baseline: 145 bpm/ Moderate variability/ +accels/ occasional variable Toco: UI with occasional ctx    Procedures Lab Results Last 24 Hours       Results for orders placed or performed during the hospital encounter of 04/09/16 (from the past 24 hour(s))  Chlamydia/NGC rt PCR ([facility] only)     Status: None   Collection Time: 04/09/16 10:30 PM  Result Value Ref Range   Specimen source GC/Chlam  URINE, RANDOM    Chlamydia Tr NOT DETECTED NOT DETECTED   N gonorrhoeae NOT DETECTED NOT DETECTED  Urine Drug Screen, Qualitative ([facility] only)     Status: None   Collection Time: 04/09/16 10:30 PM  Result Value Ref Range   Tricyclic, Ur Screen NONE DETECTED NONE DETECTED   Amphetamines, Ur Screen NONE DETECTED NONE DETECTED   MDMA (Ecstasy)Ur Screen NONE DETECTED NONE DETECTED   Cocaine Metabolite,Ur [place] NONE DETECTED NONE DETECTED   Opiate, Ur Screen NONE DETECTED NONE DETECTED   Phencyclidine (PCP) Ur S NONE DETECTED NONE DETECTED   Cannabinoid 50 Ng, Ur  NONE  DETECTED NONE DETECTED   Barbiturates, Ur Screen NONE DETECTED NONE DETECTED   Benzodiazepine, Ur Scrn NONE DETECTED NONE DETECTED   Methadone Scn, Ur NONE DETECTED NONE DETECTED  Urinalysis complete, with microscopic ([facility] only)     Status: Abnormal   Collection Time: 04/09/16 10:37 PM  Result Value Ref Range   Color, Urine STRAW (A) YELLOW   APPearance CLEAR (A) CLEAR   Glucose, UA NEGATIVE NEGATIVE mg/dL   Bilirubin Urine NEGATIVE NEGATIVE   Ketones, ur NEGATIVE NEGATIVE mg/dL   Specific Gravity, Urine 1.005 1.005 - 1.030   Hgb urine dipstick 1+ (A) NEGATIVE   pH 7.0 5.0 - 8.0   Protein, ur NEGATIVE NEGATIVE mg/dL   Nitrite NEGATIVE NEGATIVE   Leukocytes, UA 3+ (A) NEGATIVE   RBC / HPF 0-5 0 - 5 RBC/hpf   WBC, UA 6-30 0 - 5 WBC/hpf   Bacteria, UA RARE (A) NONE SEEN   Squamous Epithelial / LPF 0-5 (A) NONE SEEN   Mucous PRESENT   Wet prep, genital     Status: Abnormal   Collection Time: 04/09/16 10:37 PM  Result Value Ref Range   Yeast Wet Prep HPF POC NONE SEEN NONE SEEN   Trich, Wet Prep PRESENT (A) NONE SEEN   Clue Cells Wet Prep HPF POC NONE SEEN NONE SEEN   WBC, Wet Prep HPF POC TOO NUMEROUS TO COUNT (A) NONE SEEN   Sperm NONE SEEN      Assessment and Plan  1. IUP at 29+3 weeks      - Limited prenatal care - UDS pending 2. Hematuria vs. Vaginal bleeding in 3rd  trimester     - UA/UC      - Wet Prep     - GC/CT - negative     - Continuous fetal monitoring  3. Trichomonas - Flagyl 2 grams x 1, dispense 4 tablets, given while in hospital 4. No evidence of vaginal bleeding  5. Hematuria and Leukocytes present  6. Fetal monitoring reactive and reassuring/appropriate for gestational age  387. . D/C Home     - Hematuria likely related to a UTI based on clinical picture- Macrobid 100mg  BID x 7 days - will call with urine culture results    - FKC's daily - handout given     - Strict precautions and warning s/s reviewed    - F/U for worsening s/s     - Needs to establish prenatal care - due for 1 hour GTT, repeat growth US, and NSTs starting at 32 weeks       Dr. Dalbert GarnetBeasley aware and agrees with plan   Karena AddisonSigmon, Meredith C 04/09/2016, 10:10 PM

## 2016-04-10 NOTE — Discharge Instructions (Signed)
Fetal Movement Counts °Patient Name: __________________________________________________ Patient Due Date: ____________________ °Performing a fetal movement count is highly recommended in high-risk pregnancies, but it is good for every pregnant woman to do. Your health care provider may ask you to start counting fetal movements at 28 weeks of the pregnancy. Fetal movements often increase: °· After eating a full meal. °· After physical activity. °· After eating or drinking something sweet or cold. °· At rest. °Pay attention to when you feel the baby is most active. This will help you notice a pattern of your baby's sleep and wake cycles and what factors contribute to an increase in fetal movement. It is important to perform a fetal movement count at the same time each day when your baby is normally most active.  °HOW TO COUNT FETAL MOVEMENTS °1. Find a quiet and comfortable area to sit or lie down on your left side. Lying on your left side provides the best blood and oxygen circulation to your baby. °2. Write down the day and time on a sheet of paper or in a journal. °3. Start counting kicks, flutters, swishes, rolls, or jabs in a 2-hour period. You should feel at least 10 movements within 2 hours. °4. If you do not feel 10 movements in 2 hours, wait 2-3 hours and count again. Look for a change in the pattern or not enough counts in 2 hours. °SEEK MEDICAL CARE IF: °· You feel less than 10 counts in 2 hours, tried twice. °· There is no movement in over an hour. °· The pattern is changing or taking longer each day to reach 10 counts in 2 hours. °· You feel the baby is not moving as he or she usually does. °Date: ____________ Movements: ____________ Start time: ____________ Finish time: ____________  °Date: ____________ Movements: ____________ Start time: ____________ Finish time: ____________ °Date: ____________ Movements: ____________ Start time: ____________ Finish time: ____________ °Date: ____________ Movements:  ____________ Start time: ____________ Finish time: ____________ °Date: ____________ Movements: ____________ Start time: ____________ Finish time: ____________ °Date: ____________ Movements: ____________ Start time: ____________ Finish time: ____________ °Date: ____________ Movements: ____________ Start time: ____________ Finish time: ____________ °Date: ____________ Movements: ____________ Start time: ____________ Finish time: ____________  °Date: ____________ Movements: ____________ Start time: ____________ Finish time: ____________ °Date: ____________ Movements: ____________ Start time: ____________ Finish time: ____________ °Date: ____________ Movements: ____________ Start time: ____________ Finish time: ____________ °Date: ____________ Movements: ____________ Start time: ____________ Finish time: ____________ °Date: ____________ Movements: ____________ Start time: ____________ Finish time: ____________ °Date: ____________ Movements: ____________ Start time: ____________ Finish time: ____________ °Date: ____________ Movements: ____________ Start time: ____________ Finish time: ____________  °Date: ____________ Movements: ____________ Start time: ____________ Finish time: ____________ °Date: ____________ Movements: ____________ Start time: ____________ Finish time: ____________ °Date: ____________ Movements: ____________ Start time: ____________ Finish time: ____________ °Date: ____________ Movements: ____________ Start time: ____________ Finish time: ____________ °Date: ____________ Movements: ____________ Start time: ____________ Finish time: ____________ °Date: ____________ Movements: ____________ Start time: ____________ Finish time: ____________ °Date: ____________ Movements: ____________ Start time: ____________ Finish time: ____________  °Date: ____________ Movements: ____________ Start time: ____________ Finish time: ____________ °Date: ____________ Movements: ____________ Start time: ____________ Finish  time: ____________ °Date: ____________ Movements: ____________ Start time: ____________ Finish time: ____________ °Date: ____________ Movements: ____________ Start time: ____________ Finish time: ____________ °Date: ____________ Movements: ____________ Start time: ____________ Finish time: ____________ °Date: ____________ Movements: ____________ Start time: ____________ Finish time: ____________ °Date: ____________ Movements: ____________ Start time: ____________ Finish time: ____________  °Date: ____________ Movements: ____________ Start time: ____________ Finish   time: ____________ °Date: ____________ Movements: ____________ Start time: ____________ Finish time: ____________ °Date: ____________ Movements: ____________ Start time: ____________ Finish time: ____________ °Date: ____________ Movements: ____________ Start time: ____________ Finish time: ____________ °Date: ____________ Movements: ____________ Start time: ____________ Finish time: ____________ °Date: ____________ Movements: ____________ Start time: ____________ Finish time: ____________ °Date: ____________ Movements: ____________ Start time: ____________ Finish time: ____________  °Date: ____________ Movements: ____________ Start time: ____________ Finish time: ____________ °Date: ____________ Movements: ____________ Start time: ____________ Finish time: ____________ °Date: ____________ Movements: ____________ Start time: ____________ Finish time: ____________ °Date: ____________ Movements: ____________ Start time: ____________ Finish time: ____________ °Date: ____________ Movements: ____________ Start time: ____________ Finish time: ____________ °Date: ____________ Movements: ____________ Start time: ____________ Finish time: ____________ °Date: ____________ Movements: ____________ Start time: ____________ Finish time: ____________  °Date: ____________ Movements: ____________ Start time: ____________ Finish time: ____________ °Date: ____________  Movements: ____________ Start time: ____________ Finish time: ____________ °Date: ____________ Movements: ____________ Start time: ____________ Finish time: ____________ °Date: ____________ Movements: ____________ Start time: ____________ Finish time: ____________ °Date: ____________ Movements: ____________ Start time: ____________ Finish time: ____________ °Date: ____________ Movements: ____________ Start time: ____________ Finish time: ____________ °Date: ____________ Movements: ____________ Start time: ____________ Finish time: ____________  °Date: ____________ Movements: ____________ Start time: ____________ Finish time: ____________ °Date: ____________ Movements: ____________ Start time: ____________ Finish time: ____________ °Date: ____________ Movements: ____________ Start time: ____________ Finish time: ____________ °Date: ____________ Movements: ____________ Start time: ____________ Finish time: ____________ °Date: ____________ Movements: ____________ Start time: ____________ Finish time: ____________ °Date: ____________ Movements: ____________ Start time: ____________ Finish time: ____________ °  °This information is not intended to replace advice given to you by your health care provider. Make sure you discuss any questions you have with your health care provider. °  °Document Released: 08/13/2006 Document Revised: 08/04/2014 Document Reviewed: 05/10/2012 °Elsevier Interactive Patient Education ©2016 [employer] °Third Trimester of Pregnancy °The third trimester is from week 29 through week 42, months 7 through 9. The third trimester is a time when the fetus is growing rapidly. At the end of the ninth month, the fetus is about 20 inches in length and weighs 6-10 pounds.  °BODY CHANGES °Your body goes through many changes during pregnancy. The changes vary from woman to woman.  °· Your weight will continue to increase. You can expect to gain 25-35 pounds (11-16 kg) by the end of the pregnancy. °· You may  begin to get stretch marks on your hips, abdomen, and breasts. °· You may urinate more often because the fetus is moving lower into your pelvis and pressing on your bladder. °· You may develop or continue to have heartburn as a result of your pregnancy. °· You may develop constipation because certain hormones are causing the muscles that push waste through your intestines to slow down. °· You may develop hemorrhoids or swollen, bulging veins (varicose veins). °· You may have pelvic pain because of the weight gain and pregnancy hormones relaxing your joints between the bones in your pelvis. Backaches may result from overexertion of the muscles supporting your posture. °· You may have changes in your hair. These can include thickening of your hair, rapid growth, and changes in texture. Some women also have hair loss during or after pregnancy, or hair that feels dry or thin. Your hair will most likely return to normal after your baby is born. °· Your breasts will continue to grow and be tender. A yellow discharge may leak from your breasts called colostrum. °· Your belly button may stick out. °·   You may feel short of breath because of your expanding uterus. °· You may notice the fetus "dropping," or moving lower in your abdomen. °· You may have a bloody mucus discharge. This usually occurs a few days to a week before labor begins. °· Your cervix becomes thin and soft (effaced) near your due date. °WHAT TO EXPECT AT YOUR PRENATAL EXAMS  °You will have prenatal exams every 2 weeks until week 36. Then, you will have weekly prenatal exams. During a routine prenatal visit: °· You will be weighed to make sure you and the fetus are growing normally. °· Your blood pressure is taken. °· Your abdomen will be measured to track your baby's growth. °· The fetal heartbeat will be listened to. °· Any test results from the previous visit will be discussed. °· You may have a cervical check near your due date to see if you have  effaced. °At around 36 weeks, your caregiver will check your cervix. At the same time, your caregiver will also perform a test on the secretions of the vaginal tissue. This test is to determine if a type of bacteria, Group B streptococcus, is present. Your caregiver will explain this further. °Your caregiver may ask you: °· What your birth plan is. °· How you are feeling. °· If you are feeling the baby move. °· If you have had any abnormal symptoms, such as leaking fluid, bleeding, severe headaches, or abdominal cramping. °· If you are using any tobacco products, including cigarettes, chewing tobacco, and electronic cigarettes. °· If you have any questions. °Other tests or screenings that may be performed during your third trimester include: °· Blood tests that check for low iron levels (anemia). °· Fetal testing to check the health, activity level, and growth of the fetus. Testing is done if you have certain medical conditions or if there are problems during the pregnancy. °· HIV (human immunodeficiency virus) testing. If you are at high risk, you may be screened for HIV during your third trimester of pregnancy. °FALSE LABOR °You may feel small, irregular contractions that eventually go away. These are called Braxton Hicks contractions, or false labor. Contractions may last for hours, days, or even weeks before true labor sets in. If contractions come at regular intervals, intensify, or become painful, it is best to be seen by your caregiver.  °SIGNS OF LABOR  °· Menstrual-like cramps. °· Contractions that are 5 minutes apart or less. °· Contractions that start on the top of the uterus and spread down to the lower abdomen and back. °· A sense of increased pelvic pressure or back pain. °· A watery or bloody mucus discharge that comes from the vagina. °If you have any of these signs before the 37th week of pregnancy, call your caregiver right away. You need to go to the hospital to get checked immediately. °HOME CARE  INSTRUCTIONS  °· Avoid all smoking, herbs, alcohol, and unprescribed drugs. These chemicals affect the formation and growth of the baby. °· Do not use any tobacco products, including cigarettes, chewing tobacco, and electronic cigarettes. If you need help quitting, ask your health care provider. You may receive counseling support and other resources to help you quit. °· Follow your caregiver's instructions regarding medicine use. There are medicines that are either safe or unsafe to take during pregnancy. °· Exercise only as directed by your caregiver. Experiencing uterine cramps is a good sign to stop exercising. °· Continue to eat regular, healthy meals. °· Wear a good support bra for   breast tenderness. °· Do not use hot tubs, steam rooms, or saunas. °· Wear your seat belt at all times when driving. °· Avoid raw meat, uncooked cheese, cat litter boxes, and soil used by cats. These carry germs that can cause birth defects in the baby. °· Take your prenatal vitamins. °· Take 1500-2000 mg of calcium daily starting at the 20th week of pregnancy until you deliver your baby. °· Try taking a stool softener (if your caregiver approves) if you develop constipation. Eat more high-fiber foods, such as fresh vegetables or fruit and whole grains. Drink plenty of fluids to keep your urine clear or pale yellow. °· Take warm sitz baths to soothe any pain or discomfort caused by hemorrhoids. Use hemorrhoid cream if your caregiver approves. °· If you develop varicose veins, wear support hose. Elevate your feet for 15 minutes, 3-4 times a day. Limit salt in your diet. °· Avoid heavy lifting, wear low heal shoes, and practice good posture. °· Rest a lot with your legs elevated if you have leg cramps or low back pain. °· Visit your dentist if you have not gone during your pregnancy. Use a soft toothbrush to brush your teeth and be gentle when you floss. °· A sexual relationship may be continued unless your caregiver directs you  otherwise. °· Do not travel far distances unless it is absolutely necessary and only with the approval of your caregiver. °· Take prenatal classes to understand, practice, and ask questions about the labor and delivery. °· Make a trial run to the hospital. °· Pack your hospital bag. °· Prepare the baby's nursery. °· Continue to go to all your prenatal visits as directed by your caregiver. °SEEK MEDICAL CARE IF: °· You are unsure if you are in labor or if your water has broken. °· You have dizziness. °· You have mild pelvic cramps, pelvic pressure, or nagging pain in your abdominal area. °· You have persistent nausea, vomiting, or diarrhea. °· You have a bad smelling vaginal discharge. °· You have pain with urination. °SEEK IMMEDIATE MEDICAL CARE IF:  °· You have a fever. °· You are leaking fluid from your vagina. °· You have spotting or bleeding from your vagina. °· You have severe abdominal cramping or pain. °· You have rapid weight loss or gain. °· You have shortness of breath with chest pain. °· You notice sudden or extreme swelling of your face, hands, ankles, feet, or legs. °· You have not felt your baby move in over an hour. °· You have severe headaches that do not go away with medicine. °· You have vision changes. °  °This information is not intended to replace advice given to you by your health care provider. Make sure you discuss any questions you have with your health care provider. °  °Document Released: 07/08/2001 Document Revised: 08/04/2014 Document Reviewed: 09/14/2012 °Elsevier Interactive Patient Education ©2016 [employer] ° °

## 2016-04-11 LAB — URINE CULTURE

## 2016-06-18 ENCOUNTER — Encounter: Payer: Self-pay | Admitting: *Deleted

## 2016-06-18 ENCOUNTER — Observation Stay
Admission: EM | Admit: 2016-06-18 | Discharge: 2016-06-19 | Disposition: A | Discharge status: Home / Self Care | Payer: Medicaid Other | Attending: Obstetrics & Gynecology | Admitting: Obstetrics & Gynecology

## 2016-06-18 DIAGNOSIS — Z3A39 39 weeks gestation of pregnancy: Secondary | ICD-10-CM | POA: Diagnosis not present

## 2016-06-18 DIAGNOSIS — O26893 Other specified pregnancy related conditions, third trimester: Secondary | ICD-10-CM | POA: Diagnosis not present

## 2016-06-18 DIAGNOSIS — F419 Anxiety disorder, unspecified: Secondary | ICD-10-CM | POA: Diagnosis not present

## 2016-06-18 DIAGNOSIS — Z7982 Long term (current) use of aspirin: Secondary | ICD-10-CM | POA: Diagnosis not present

## 2016-06-18 DIAGNOSIS — F3181 Bipolar II disorder: Secondary | ICD-10-CM | POA: Insufficient documentation

## 2016-06-18 DIAGNOSIS — N898 Other specified noninflammatory disorders of vagina: Secondary | ICD-10-CM | POA: Insufficient documentation

## 2016-06-18 DIAGNOSIS — O99353 Diseases of the nervous system complicating pregnancy, third trimester: Secondary | ICD-10-CM | POA: Diagnosis not present

## 2016-06-18 DIAGNOSIS — O99343 Other mental disorders complicating pregnancy, third trimester: Secondary | ICD-10-CM | POA: Insufficient documentation

## 2016-06-18 DIAGNOSIS — B9689 Other specified bacterial agents as the cause of diseases classified elsewhere: Secondary | ICD-10-CM | POA: Diagnosis not present

## 2016-06-18 DIAGNOSIS — G43909 Migraine, unspecified, not intractable, without status migrainosus: Secondary | ICD-10-CM | POA: Insufficient documentation

## 2016-06-18 DIAGNOSIS — O26899 Other specified pregnancy related conditions, unspecified trimester: Secondary | ICD-10-CM | POA: Diagnosis present

## 2016-06-18 LAB — WET PREP, GENITAL
CLUE CELLS WET PREP: NONE SEEN
SPERM: NONE SEEN
Yeast Wet Prep HPF POC: NONE SEEN

## 2016-06-18 MED ORDER — ONDANSETRON HCL 4 MG/2ML IJ SOLN: 4.0000 mg | Freq: Four times a day (QID) | INTRAMUSCULAR | Status: DC | PRN

## 2016-06-18 MED ORDER — ACETAMINOPHEN 325 MG PO TABS: 650.0000 mg | ORAL_TABLET | ORAL | Status: DC | PRN

## 2016-06-18 NOTE — OB Triage Note (Signed)
Patient states she started leaking clear fluid Monday night and started having greenish dischargeyesterday 2200, pt states no odor. Denies contractions. Pt does state she has pressure in mid abdomen and hips.

## 2016-06-19 DIAGNOSIS — O26893 Other specified pregnancy related conditions, third trimester: Secondary | ICD-10-CM | POA: Diagnosis not present

## 2016-06-19 MED ORDER — AZITHROMYCIN 250 MG PO TABS
1000.0000 mg | ORAL_TABLET | Freq: Once | ORAL | Status: AC
  Administered 2016-06-19: 1000 mg via ORAL
  Filled 2016-06-19: qty 4

## 2016-06-19 NOTE — H&P (Signed)
Obstetrics Admission History & Physical   leaking fluid   HPI:  20 y.o. G2P0010 @ 925w4d (06/22/2016, by Patient Reported). Admitted on 06/18/2016:   Patient Active Problem List   Diagnosis Date Noted  . Vaginal discharge during pregnancy 06/18/2016  . Pregnancy with abdominal pain of right upper quadrant, antepartum 04/09/2016  . Neck pain 01/19/2015  . Low back pain 01/19/2015  . Bipolar 2 disorder (HCC)   . Migraine   . Tachycardia   . Chest pain 12/08/2013  . Rapid heart beat 12/08/2013     Presents for c/o vaginal discharge, some pains in lower abd and vagina.  No VB or ROM.  Good FM.Marland Kitchen.  [facility]  PMHx:  Past Medical History:  Diagnosis Date  . Anxiety   . Bipolar 2 disorder (HCC)   . Depression   . Migraine   . Tachycardia    PSHx:  Past Surgical History:  Procedure Laterality Date  . CHOLECYSTECTOMY     Medications:  Prescriptions Prior to Admission  Medication Sig Dispense Refill Last Dose  . acetaminophen (TYLENOL) 160 MG chewable tablet Chew 320 mg by mouth every 6 (six) hours as needed for pain. Reported on 07/11/2015   Unknown at Unknown time  . aspirin 81 MG chewable tablet Chew 81 mg by mouth daily.   Unknown at Unknown time  . nitrofurantoin, macrocrystal-monohydrate, (MACROBID) 100 MG capsule Take 1 capsule (100 mg total) by mouth 2 (two) times daily. (Patient not taking: Reported on 06/18/2016) 14 capsule 0 Unknown at Unknown time  . Prenatal Vit-Fe Fumarate-FA (PRENATAL MULTIVITAMIN) TABS tablet Take 1 tablet by mouth daily at 12 noon. Reported on 07/11/2015   Not Taking at Unknown time   Allergies: is allergic to imitrex [sumatriptan]. OBHx:  OB History  Gravida Para Term Preterm AB Living  2       1    SAB TAB Ectopic Multiple Live Births               # Outcome Date GA Lbr Len/2nd Weight Sex Delivery Anes PTL Lv  2 Current           1 AB 07/21/15 4042w0d    SAB        ZOX:[id number]:Negative/unremarkable except as detailed in  HPI. Soc Hx: Alcohol: none and Recreational drug use: none  Objective:   Vitals:   06/18/16 2200  BP: 137/74  Pulse: (!) 120  Resp: 18  Temp: 98.1 F (36.7 C)   General: Well nourished, well developed female in no acute distress.  Skin: Warm and dry.  Cardiovascular:Regular rate and rhythm. Respiratory: Clear to auscultation bilateral. Normal respiratory effort Abdomen: mild Neuro/Psych: Normal mood and affect.   Pelvic exam: is not limited by body habitus EGBUS: within normal limits Vagina: within normal limits and with normal mucosa, green discharge, no blood in the vault Cervix: closed Uterus: No contractions observed for 60 minutes.  EFM:FHR: 140 bpm, variability: moderate,  accelerations:  Present,  decelerations:  Absent Toco: None  Assessment & Plan:   20 y.o. G2P0010 @ 755w4d, Admitted on 06/18/2016: Trich infection by Wet Prep Treat w Azithromycin ABX now. Partner needs to be treated F/u office

## 2016-06-19 NOTE — Discharge Instructions (Signed)
Drink plenty of fluid and get plenty of rest. Call your provider for any other concerns °

## 2016-06-19 NOTE — Discharge Summary (Signed)
Physician Discharge Summary  Patient ID: Cynthia JeanHannah B Francis MRN: [id number] DOB/AGE: 1995-12-18 20 y.o.  Admit date: 06/18/2016 Discharge date: 06/19/2016  Admission Diagnoses:  Discharge Diagnoses:  Active Problems:   Vaginal discharge during pregnancy   Discharged Condition: good  Hospital Course: Seen and examined for vag d/c, found to have Trich infection and this is treated w Azithromycin.  Cont prenatal care and retest as outpt.  Significant Diagnostic Studies: A NST procedure was performed with FHR monitoring and a normal baseline established, appropriate time of 20-40 minutes of evaluation, and accels >2 seen w 15x15 characteristics.  Results show a REACTIVE NST.   Discharge Exam: Blood pressure 137/74, pulse (!) 120, temperature 98.1 F (36.7 C), temperature source Oral, resp. rate 18, height 5\' 6"  (1.676 m), weight 265 lb (120.2 kg), last menstrual period 09/07/2015. see H&P  Disposition: 01-Home or Self Care     Medication List    TAKE these medications   acetaminophen 160 MG chewable tablet Commonly known as:  TYLENOL Chew 320 mg by mouth every 6 (six) hours as needed for pain. Reported on 07/11/2015   aspirin 81 MG chewable tablet Chew 81 mg by mouth daily.   nitrofurantoin (macrocrystal-monohydrate) 100 MG capsule Commonly known as:  MACROBID Take 1 capsule (100 mg total) by mouth 2 (two) times daily.   prenatal multivitamin Tabs tablet Take 1 tablet by mouth daily at 12 noon. Reported on 07/11/2015        Signed: Letitia Libraobert Paul Harris 06/19/2016, 12:30 AM

## 2016-06-19 NOTE — Discharge Summary (Signed)
Patient discharged home, discharge instructions given, patient states understanding. Patient left floor in stable condition, denies any other needs at this time. Patient to keep next scheduled OB appointment 

## 2016-06-26 ENCOUNTER — Inpatient Hospital Stay
Admission: EM | Admit: 2016-06-26 | Discharge: 2016-06-29 | DRG: 775 | Disposition: A | Discharge status: Home / Self Care | Payer: Medicaid Other | Attending: Advanced Practice Midwife | Admitting: Advanced Practice Midwife

## 2016-06-26 DIAGNOSIS — O99344 Other mental disorders complicating childbirth: Secondary | ICD-10-CM | POA: Diagnosis present

## 2016-06-26 DIAGNOSIS — Z3A40 40 weeks gestation of pregnancy: Secondary | ICD-10-CM | POA: Diagnosis not present

## 2016-06-26 DIAGNOSIS — Z87891 Personal history of nicotine dependence: Secondary | ICD-10-CM | POA: Diagnosis not present

## 2016-06-26 DIAGNOSIS — F418 Other specified anxiety disorders: Secondary | ICD-10-CM | POA: Diagnosis present

## 2016-06-26 DIAGNOSIS — O480 Post-term pregnancy: Principal | ICD-10-CM | POA: Diagnosis present

## 2016-06-26 LAB — CBC
HEMATOCRIT: 34.9 % — AB (ref 35.0–47.0)
Hemoglobin: 11.9 g/dL — ABNORMAL LOW (ref 12.0–16.0)
MCH: 28.5 pg (ref 26.0–34.0)
MCHC: 34 g/dL (ref 32.0–36.0)
MCV: 83.9 fL (ref 80.0–100.0)
PLATELETS: 184 10*3/uL (ref 150–440)
RBC: 4.17 MIL/uL (ref 3.80–5.20)
RDW: 16.5 % — AB (ref 11.5–14.5)
WBC: 8 10*3/uL (ref 3.6–11.0)

## 2016-06-26 LAB — URINE DRUG SCREEN, QUALITATIVE (ARMC ONLY)
AMPHETAMINES, UR SCREEN: NOT DETECTED
BARBITURATES, UR SCREEN: NOT DETECTED
BENZODIAZEPINE, UR SCRN: NOT DETECTED
CANNABINOID 50 NG, UR ~~LOC~~: NOT DETECTED
Cocaine Metabolite,Ur ~~LOC~~: NOT DETECTED
MDMA (Ecstasy)Ur Screen: NOT DETECTED
Methadone Scn, Ur: NOT DETECTED
OPIATE, UR SCREEN: NOT DETECTED
PHENCYCLIDINE (PCP) UR S: NOT DETECTED
Tricyclic, Ur Screen: NOT DETECTED

## 2016-06-26 LAB — CHLAMYDIA/NGC RT PCR (ARMC ONLY)
Chlamydia Tr: NOT DETECTED
N gonorrhoeae: NOT DETECTED

## 2016-06-26 LAB — TYPE AND SCREEN
ABO/RH(D): A POS
ANTIBODY SCREEN: NEGATIVE

## 2016-06-26 MED ORDER — LIDOCAINE HCL (PF) 1 % IJ SOLN: 30.0000 mL | INTRAMUSCULAR | Status: DC | PRN

## 2016-06-26 MED ORDER — SODIUM CHLORIDE FLUSH 0.9 % IV SOLN
INTRAVENOUS | Status: AC
  Filled 2016-06-26: qty 10

## 2016-06-26 MED ORDER — DINOPROSTONE 10 MG VA INST
10.0000 mg | VAGINAL_INSERT | Freq: Once | VAGINAL | Status: AC
  Administered 2016-06-26: 10 mg via VAGINAL
  Filled 2016-06-26: qty 1

## 2016-06-26 MED ORDER — LACTATED RINGERS IV SOLN
INTRAVENOUS | Status: DC
  Administered 2016-06-26 – 2016-06-27 (×3): via INTRAVENOUS

## 2016-06-26 MED ORDER — ONDANSETRON HCL 4 MG/2ML IJ SOLN: 4.0000 mg | Freq: Four times a day (QID) | INTRAMUSCULAR | Status: DC | PRN

## 2016-06-26 MED ORDER — ACETAMINOPHEN 325 MG PO TABS: 650.0000 mg | ORAL_TABLET | ORAL | Status: DC | PRN

## 2016-06-26 MED ORDER — TERBUTALINE SULFATE 1 MG/ML IJ SOLN: 0.2500 mg | Freq: Once | INTRAMUSCULAR | Status: DC | PRN

## 2016-06-26 MED ORDER — BUTORPHANOL TARTRATE 1 MG/ML IJ SOLN
1.0000 mg | INTRAMUSCULAR | Status: DC | PRN
  Administered 2016-06-27 (×5): 1 mg via INTRAVENOUS
  Filled 2016-06-26 (×5): qty 1

## 2016-06-26 MED ORDER — SOD CITRATE-CITRIC ACID 500-334 MG/5ML PO SOLN: 30.0000 mL | ORAL | Status: DC | PRN

## 2016-06-26 MED ORDER — LACTATED RINGERS IV SOLN
500.0000 mL | INTRAVENOUS | Status: DC | PRN
  Administered 2016-06-27: 300 mL via INTRAVENOUS

## 2016-06-26 NOTE — H&P (Signed)
Date of Initial paper H&P: 06/23/2016  History reviewed, patient examined, no change in status, stable for surgery.

## 2016-06-26 NOTE — OB Triage Note (Signed)
Scheduled induction for post dates.

## 2016-06-27 ENCOUNTER — Inpatient Hospital Stay: Payer: Medicaid Other | Admitting: Registered Nurse

## 2016-06-27 MED ORDER — ZOLPIDEM TARTRATE 5 MG PO TABS
5.0000 mg | ORAL_TABLET | Freq: Once | ORAL | Status: AC
  Administered 2016-06-27: 5 mg via ORAL
  Filled 2016-06-27: qty 1

## 2016-06-27 MED ORDER — SIMETHICONE 80 MG PO CHEW
80.0000 mg | CHEWABLE_TABLET | ORAL | Status: DC | PRN
  Administered 2016-06-28: 80 mg via ORAL
  Filled 2016-06-27: qty 1

## 2016-06-27 MED ORDER — DIBUCAINE 1 % RE OINT: 1.0000 "application " | TOPICAL_OINTMENT | RECTAL | Status: DC | PRN

## 2016-06-27 MED ORDER — IBUPROFEN 600 MG PO TABS
ORAL_TABLET | ORAL | Status: AC
  Filled 2016-06-27: qty 1

## 2016-06-27 MED ORDER — FENTANYL 2.5 MCG/ML W/ROPIVACAINE 0.2% IN NS 100 ML EPIDURAL INFUSION (ARMC-ANES)
EPIDURAL | Status: DC | PRN
  Administered 2016-06-27: 10 mL/h via EPIDURAL

## 2016-06-27 MED ORDER — EPHEDRINE 5 MG/ML INJ
10.0000 mg | INTRAVENOUS | Status: DC | PRN
  Filled 2016-06-27: qty 2

## 2016-06-27 MED ORDER — PHENYLEPHRINE 40 MCG/ML (10ML) SYRINGE FOR IV PUSH (FOR BLOOD PRESSURE SUPPORT)
80.0000 ug | PREFILLED_SYRINGE | INTRAVENOUS | Status: DC | PRN
  Filled 2016-06-27: qty 5

## 2016-06-27 MED ORDER — ONDANSETRON HCL 4 MG PO TABS: 4.0000 mg | ORAL_TABLET | ORAL | Status: DC | PRN

## 2016-06-27 MED ORDER — LACTATED RINGERS IV SOLN: 500.0000 mL | Freq: Once | INTRAVENOUS | Status: DC

## 2016-06-27 MED ORDER — LIDOCAINE HCL (PF) 1 % IJ SOLN
INTRAMUSCULAR | Status: DC | PRN
  Administered 2016-06-27: 3 mL

## 2016-06-27 MED ORDER — IBUPROFEN 600 MG PO TABS
600.0000 mg | ORAL_TABLET | Freq: Four times a day (QID) | ORAL | Status: DC
  Administered 2016-06-27 – 2016-06-29 (×7): 600 mg via ORAL
  Filled 2016-06-27 (×6): qty 1

## 2016-06-27 MED ORDER — FENTANYL 2.5 MCG/ML W/ROPIVACAINE 0.2% IN NS 100 ML EPIDURAL INFUSION (ARMC-ANES): 10.0000 mL/h | EPIDURAL | Status: DC

## 2016-06-27 MED ORDER — ONDANSETRON HCL 4 MG/2ML IJ SOLN: 4.0000 mg | INTRAMUSCULAR | Status: DC | PRN

## 2016-06-27 MED ORDER — OXYTOCIN 40 UNITS IN LACTATED RINGERS INFUSION - SIMPLE MED
1.0000 m[IU]/min | INTRAVENOUS | Status: DC
  Administered 2016-06-27: 1 m[IU]/min via INTRAVENOUS

## 2016-06-27 MED ORDER — ACETAMINOPHEN 325 MG PO TABS
650.0000 mg | ORAL_TABLET | ORAL | Status: DC | PRN
  Administered 2016-06-28: 650 mg via ORAL
  Filled 2016-06-27: qty 2

## 2016-06-27 MED ORDER — BENZOCAINE-MENTHOL 20-0.5 % EX AERO: 1.0000 "application " | INHALATION_SPRAY | CUTANEOUS | Status: DC | PRN

## 2016-06-27 MED ORDER — OXYCODONE HCL 5 MG PO TABS: 5.0000 mg | ORAL_TABLET | ORAL | Status: DC | PRN

## 2016-06-27 MED ORDER — OXYTOCIN BOLUS FROM INFUSION: 500.0000 mL | Freq: Once | INTRAVENOUS | Status: DC

## 2016-06-27 MED ORDER — LIDOCAINE-EPINEPHRINE (PF) 1.5 %-1:200000 IJ SOLN
INTRAMUSCULAR | Status: DC | PRN
  Administered 2016-06-27: 3 mL via PERINEURAL

## 2016-06-27 MED ORDER — SODIUM CHLORIDE FLUSH 0.9 % IV SOLN
INTRAVENOUS | Status: AC
  Filled 2016-06-27: qty 10

## 2016-06-27 MED ORDER — DIPHENHYDRAMINE HCL 50 MG/ML IJ SOLN: 12.5000 mg | INTRAMUSCULAR | Status: DC | PRN

## 2016-06-27 MED ORDER — BUPIVACAINE HCL (PF) 0.25 % IJ SOLN
INTRAMUSCULAR | Status: DC | PRN
  Administered 2016-06-27 (×2): 5 mL via EPIDURAL

## 2016-06-27 MED ORDER — OXYCODONE HCL 5 MG PO TABS
10.0000 mg | ORAL_TABLET | ORAL | Status: DC | PRN
  Administered 2016-06-27 – 2016-06-28 (×5): 10 mg via ORAL
  Filled 2016-06-27 (×4): qty 2

## 2016-06-27 MED ORDER — COCONUT OIL OIL
1.0000 "application " | TOPICAL_OIL | Status: DC | PRN
  Administered 2016-06-28: 1 via TOPICAL
  Filled 2016-06-27: qty 120

## 2016-06-27 MED ORDER — SENNOSIDES-DOCUSATE SODIUM 8.6-50 MG PO TABS
2.0000 | ORAL_TABLET | ORAL | Status: DC
  Administered 2016-06-28: 2 via ORAL
  Filled 2016-06-27: qty 2

## 2016-06-27 MED ORDER — FENTANYL 2.5 MCG/ML W/ROPIVACAINE 0.2% IN NS 100 ML EPIDURAL INFUSION (ARMC-ANES)
EPIDURAL | Status: AC
  Filled 2016-06-27: qty 100

## 2016-06-27 MED ORDER — PRENATAL MULTIVITAMIN CH
1.0000 | ORAL_TABLET | Freq: Every day | ORAL | Status: DC
  Administered 2016-06-28 – 2016-06-29 (×2): 1 via ORAL
  Filled 2016-06-27 (×2): qty 1

## 2016-06-27 MED ORDER — DIPHENHYDRAMINE HCL 25 MG PO CAPS: 25.0000 mg | ORAL_CAPSULE | Freq: Four times a day (QID) | ORAL | Status: DC | PRN

## 2016-06-27 MED ORDER — TETANUS-DIPHTH-ACELL PERTUSSIS 5-2.5-18.5 LF-MCG/0.5 IM SUSP: 0.5000 mL | Freq: Once | INTRAMUSCULAR | Status: DC

## 2016-06-27 MED ORDER — WITCH HAZEL-GLYCERIN EX PADS: 1.0000 "application " | MEDICATED_PAD | CUTANEOUS | Status: DC | PRN

## 2016-06-27 MED ORDER — OXYTOCIN 40 UNITS IN LACTATED RINGERS INFUSION - SIMPLE MED
2.5000 [IU]/h | INTRAVENOUS | Status: DC
  Filled 2016-06-27: qty 1000

## 2016-06-27 MED ORDER — OXYCODONE HCL 5 MG PO TABS
ORAL_TABLET | ORAL | Status: AC
  Filled 2016-06-27: qty 2

## 2016-06-27 NOTE — Anesthesia Procedure Notes (Signed)
Epidural Patient location during procedure: OB Start time: 06/27/2016 1:53 PM End time: 06/27/2016 1:55 PM  Staffing Anesthesiologist: Yevette EdwardsADAMS, JAMES G Resident/CRNA: Ginger CarneMICHELET, STEPHANIE Performed: resident/CRNA   Preanesthetic Checklist Completed: patient identified, site marked, surgical consent, pre-op evaluation, timeout performed, IV checked, risks and benefits discussed and monitors and equipment checked  Epidural Patient position: sitting Prep: Betadine Patient monitoring: heart rate, continuous pulse ox and blood pressure Approach: midline Location: L4-L5 Injection technique: LOR saline and LOR air  Needle:  Needle type: Tuohy  Needle gauge: 17 G Needle length: 9 cm and 9 Needle insertion depth: 9 cm Catheter type: closed end flexible Catheter size: 20 Guage Catheter at skin depth: 12 cm Test dose: negative and 1.5% lidocaine with Epi 1:200 K  Assessment Sensory level: T10 Events: blood not aspirated, injection not painful, no injection resistance, negative IV test and no paresthesia  Additional Notes   Patient tolerated the insertion well without complications.-SATD -IVTD. No paresthesia. [facility] for VS and dosingReason for block:procedure for pain

## 2016-06-27 NOTE — Progress Notes (Signed)
  Labor Progress Note   20 y.o. G2P0010 @ 4278w5d , admitted for  Pregnancy, Labor Management. Induction of labor for postdates.  Subjective:  Pt is increasingly uncomfortable and not coping well with contraction pain. She is requesting medicine for pain  Objective:  BP 108/63   Pulse 82   Temp 98.1 F (36.7 C) (Oral)   Resp 18   Ht 5\' 6"  (1.676 m)   Wt 270 lb (122.5 kg)   LMP 09/07/2015   BMI 43.58 kg/m  Abd: mild Extr: trace to 1+ bilateral pedal edema SVE: CERVIX: 3/90/-2  EFM: FHR: 135 bpm, variability: moderate,  accelerations:  Present,  decelerations:  Absent Toco: Frequency: Every 2-4 minutes    Assessment & Plan:  G2P0010 @ 1278w5d, admitted for  Pregnancy and Labor/Delivery Management  1. Pain management: epidural to be placed 2. FWB: FHT category I.  3. ID: GBS negative 4. Labor management: induction to proceed with pitocin All discussed with patient, see orders   Tresea MallGLEDHILL,Rizwan Kuyper, CNM

## 2016-06-27 NOTE — Discharge Summary (Signed)
OB Discharge Summary     Patient Name: Cynthia Francis DOB: Apr 24, 1996 MRN: [id number]  Date of admission: 06/26/2016 Delivering Provider: Obie DredgeGLEDHILL,JANE, CNM  Date of Delivery: 06/27/2016  Date of discharge: 06/29/16  Admitting diagnosis: induction Intrauterine pregnancy: 7059w5d     Secondary diagnosis: anxiety, depression, trich infection 11/22 treated      Discharge diagnosis: Term Pregnancy Delivered                                                                                                Post partum procedures:none  Augmentation: Pitocin and Cervidil  Complications: None  Hospital course:  Induction of Labor With Vaginal Delivery   20 y.o. G2P0010 at 5459w5d was admitted to the hospital 06/26/2016 for induction of labor.   Indication for induction: Postdates. Patient had an uncomplicated labor course as follows: Membrane Rupture Time/Date: 8:05 AM ,06/27/2016   Delivery of viable female 06/27/2016 at 7:51 pm Details of delivery can be found in separate delivery note.   Patient had a routine postpartum course. Patient is discharged home .   Physical exam  Vitals:   06/28/16 0754 06/28/16 1218 06/28/16 1543 06/28/16 1954  BP: (!) 109/59 123/81 129/82 115/72  Pulse: 81 94 96 (!) 101  Resp: 16 20 18 20   Temp: 98.5 F (36.9 C) 98 F (36.7 C) 97.9 F (36.6 C) 98.2 F (36.8 C)  TempSrc: Oral Oral Oral Oral  SpO2: 98% 98% 98%   Weight:      Height:       General: alert, cooperative and no distress Lochia: appropriate Uterine Fundus: firm Incision: N/A DVT Evaluation: No evidence of DVT seen on physical exam.  Labs: Lab Results  Component Value Date   WBC 10.7 06/28/2016   HGB 10.9 (L) 06/28/2016   HCT 32.1 (L) 06/28/2016   MCV 83.6 06/28/2016   PLT 158 06/28/2016   CMP Latest Ref Rng & Units 12/17/2015  Glucose 65 - 99 mg/dL 409(W105(H)  BUN 6 - 20 mg/dL 7  Creatinine 1.190.44 - 1.471.00 mg/dL 8.290.53  Sodium 562135 - 130145 mmol/L 137  Potassium 3.5 - 5.1 mmol/L 3.4(L)   Chloride 101 - 111 mmol/L 106  CO2 22 - 32 mmol/L 21(L)  Calcium 8.9 - 10.3 mg/dL 9.1  Total Protein 6.5 - 8.1 g/dL 7.0  Total Bilirubin 0.3 - 1.2 mg/dL 0.3  Alkaline Phos 38 - 126 U/L 44  AST 15 - 41 U/L 17  ALT 14 - 54 U/L 14   Prenatal Labs: A+, RI, VI  Discharge instruction: per After Visit Summary.  Medications:    Medication List    STOP taking these medications   nitrofurantoin (macrocrystal-monohydrate) 100 MG capsule Commonly known as:  MACROBID     TAKE these medications   acetaminophen 160 MG chewable tablet Commonly known as:  TYLENOL Chew 320 mg by mouth every 6 (six) hours as needed for pain. Reported on 07/11/2015   aspirin 81 MG chewable tablet Chew 81 mg by mouth daily.   prenatal multivitamin Tabs tablet Take 1 tablet by mouth daily at 12 noon. Reported on 07/11/2015  Diet: routine diet  Activity: Advance as tolerated. Pelvic rest for 6 weeks.   Outpatient follow up: Follow-up Information    [facility] [place]. Schedule an appointment as soon as possible for a visit in 6 week(s).   Contact information: [address] [place] [address] [phone]  [place] and will follow up with care provider there          Postpartum contraception: Nexplanon Rhogam Given postpartum: NA Rubella vaccine given postpartum: Rubella Immune Varicella vaccine given postpartum: Varicella Immune TDaP given antepartum or postpartum: given antepartum  Newborn Data: Live born female  Birth Weight: 3810g APGAR: 8, 9  Baby Feeding: Breast  Disposition:home with mother  SIGNED:  Letitia Libraobert Paul Rosamaria Donn, MD 06/29/2016 6:58 AM

## 2016-06-27 NOTE — Progress Notes (Signed)
  Labor Progress Note   20 y.o. G2P0010 @ 415w5d , admitted for  Pregnancy, Labor Management. Induction of labor for postdates  Subjective:  Pt has c/o pain in her back with contractions. She is unable to move herself to get comfortable.   Objective:  BP 115/77   Pulse (!) 109   Temp 97.9 F (36.6 C) (Oral)   Resp 18   Ht 5\' 6"  (1.676 m)   Wt 270 lb (122.5 kg)   LMP 09/07/2015   SpO2 98%   BMI 43.58 kg/m  Abd: mild Extr: trace to 1+ bilateral pedal edema SVE: CERVIX: 9 cm dilated, 100 effaced, -1 station  EFM: FHR: 130 bpm, variability: moderate,  accelerations:  Present,  decelerations:  Absent Toco: Frequency: Every 2-4 minutes    Assessment & Plan:  G2P0010 @ 605w5d, admitted for  Pregnancy and Labor/Delivery Management, IOL for postdates  1. Pain management: epidural. 2. FWB: FHT category I.  3. ID: GBS negative 4. Labor management: expectant for vaginal delivery All discussed with patient, see orders  Taft Worthing, CNM

## 2016-06-27 NOTE — Anesthesia Preprocedure Evaluation (Addendum)
Anesthesia Evaluation  Patient identified by MRN, date of birth, ID band Patient awake    Reviewed: Allergy & Precautions, H&P , NPO status , Patient's Chart, lab work & pertinent test results, reviewed documented beta blocker date and time   Airway Mallampati: II  TM Distance: >3 FB Neck ROM: full    Dental no notable dental hx. (+) Teeth Intact   Pulmonary neg pulmonary ROS, former smoker,    Pulmonary exam normal breath sounds clear to auscultation       Cardiovascular Exercise Tolerance: Good negative cardio ROS   Rhythm:regular Rate:Normal     Neuro/Psych  Headaches, PSYCHIATRIC DISORDERS Anxiety Depression Bipolar Disorder negative neurological ROS  negative psych ROS   GI/Hepatic negative GI ROS, Neg liver ROS,   Endo/Other  negative endocrine ROSdiabetes  Renal/GU      Musculoskeletal   Abdominal   Peds  Hematology negative hematology ROS (+)   Anesthesia Other Findings   Reproductive/Obstetrics (+) Pregnancy                            Anesthesia Physical Anesthesia Plan  ASA: II  Anesthesia Plan: Epidural   Post-op Pain Management:    Induction:   Airway Management Planned:   Additional Equipment:   Intra-op Plan:   Post-operative Plan:   Informed Consent: I have reviewed the patients History and Physical, chart, labs and discussed the procedure including the risks, benefits and alternatives for the proposed anesthesia with the patient or authorized representative who has indicated his/her understanding and acceptance.     Plan Discussed with:   Anesthesia Plan Comments:         Anesthesia Quick Evaluation

## 2016-06-28 LAB — CBC
HEMATOCRIT: 32.1 % — AB (ref 35.0–47.0)
Hemoglobin: 10.9 g/dL — ABNORMAL LOW (ref 12.0–16.0)
MCH: 28.4 pg (ref 26.0–34.0)
MCHC: 34 g/dL (ref 32.0–36.0)
MCV: 83.6 fL (ref 80.0–100.0)
PLATELETS: 158 10*3/uL (ref 150–440)
RBC: 3.84 MIL/uL (ref 3.80–5.20)
RDW: 16.1 % — AB (ref 11.5–14.5)
WBC: 10.7 10*3/uL (ref 3.6–11.0)

## 2016-06-28 LAB — RPR: RPR: NONREACTIVE

## 2016-06-28 NOTE — Progress Notes (Signed)
Admit Date: 06/26/2016 Today's Date: 06/28/2016  Post Partum Day 1/2  Subjective:  no complaints, up ad lib, voiding and tolerating PO  Objective: Temp:  [97.9 F (36.6 C)-99 F (37.2 C)] 98.5 F (36.9 C) (12/02 0754) Pulse Rate:  [81-119] 81 (12/02 0754) Resp:  [16-18] 16 (12/02 0754) BP: (85-147)/(46-91) 109/59 (12/02 0754) SpO2:  [97 %-99 %] 98 % (12/02 0754)  Physical Exam:  General: alert, cooperative and no distress Lochia: appropriate Uterine Fundus: firm Incision: none DVT Evaluation: No evidence of DVT seen on physical exam.   Recent Labs  06/26/16 2029 06/28/16 0537  HGB 11.9* 10.9*  HCT 34.9* 32.1*    Assessment/Plan: Plan for discharge tomorrow, Breastfeeding, Contraception (Nexplanon) and Infant doing well   LOS: 2 days   Letitia Libraobert Paul Torrey Horseman [facility] 06/28/2016, 9:10 AM

## 2016-06-28 NOTE — Anesthesia Postprocedure Evaluation (Signed)
Anesthesia Post Note  Patient: Cynthia Francis  Procedure(s) Performed: * No procedures listed *  Patient location during evaluation: Mother Baby Anesthesia Type: Epidural Level of consciousness: awake and alert and oriented Pain management: pain level controlled Vital Signs Assessment: post-procedure vital signs reviewed and stable Respiratory status: spontaneous breathing Cardiovascular status: blood pressure returned to baseline Postop Assessment: no headache, no backache and patient able to bend at knees Anesthetic complications: no    Last Vitals:  Vitals:   06/28/16 0754 06/28/16 1218  BP: (!) 109/59 123/81  Pulse: 81 94  Resp: 16 20  Temp: 36.9 C 36.7 C    Last Pain:  Vitals:   06/28/16 1218  TempSrc: Oral  PainSc:                  Brayan Votaw

## 2016-06-29 NOTE — Progress Notes (Signed)
Patient discharge to home via wheelchair with spouse and baby in car seat.  

## 2016-06-29 NOTE — Progress Notes (Signed)
Admit Date: 06/26/2016 Today's Date: 06/29/2016  Post Partum Day 2  Subjective:  no complaints, up ad lib, voiding and tolerating PO  Objective: Temp:  [97.9 F (36.6 C)-98.5 F (36.9 C)] 98.2 F (36.8 C) (12/02 1954) Pulse Rate:  [81-101] 101 (12/02 1954) Resp:  [16-20] 20 (12/02 1954) BP: (109-129)/(59-82) 115/72 (12/02 1954) SpO2:  [98 %] 98 % (12/02 1543)  Physical Exam:  General: alert, cooperative and no distress Lochia: appropriate Uterine Fundus: firm Incision: none DVT Evaluation: No evidence of DVT seen on physical exam.   Recent Labs  06/26/16 2029 06/28/16 0537  HGB 11.9* 10.9*  HCT 34.9* 32.1*    Assessment/Plan: Plan for discharge  Breastfeeding, Contraception (Nexplanon) and Infant doing well   LOS: 3 days   Letitia Libraobert Paul Gregoria Selvy [facility] 06/29/2016, 6:58 AM

## 2017-05-07 IMAGING — US US OB COMP LESS 14 WK
1 series · 14 of 28 positions shown · non-contrast
Comparison: None.

CLINICAL DATA: Lower abdominal pain and cramping for a few weeks.
Quantitative beta HCG [DATE]. Estimated gestational age per LMP 14
weeks 3 days.

EXAM:
OBSTETRIC <14 WK ULTRASOUND
TECHNIQUE: Transabdominal ultrasound was performed for evaluation of the
gestation as well as the maternal uterus and adnexal regions.

[Series 1: us ob comp less 14 wk · 0.22mm/px · 14 of 49 slices shown]
[im 2/49]
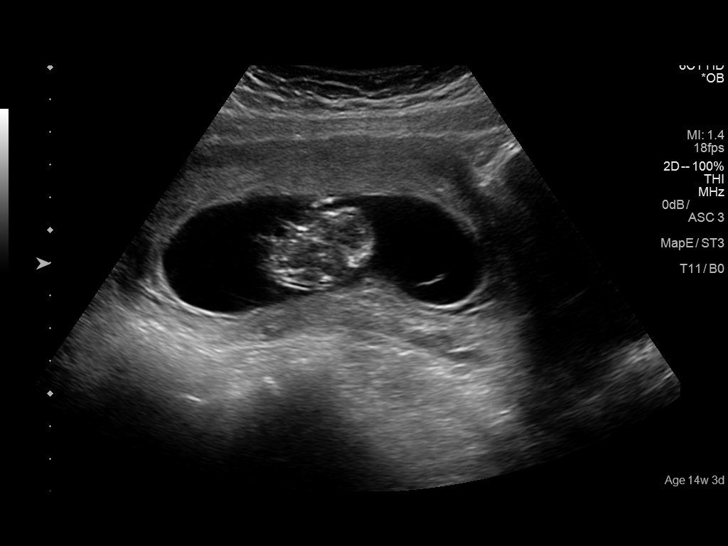
[im 6/49]
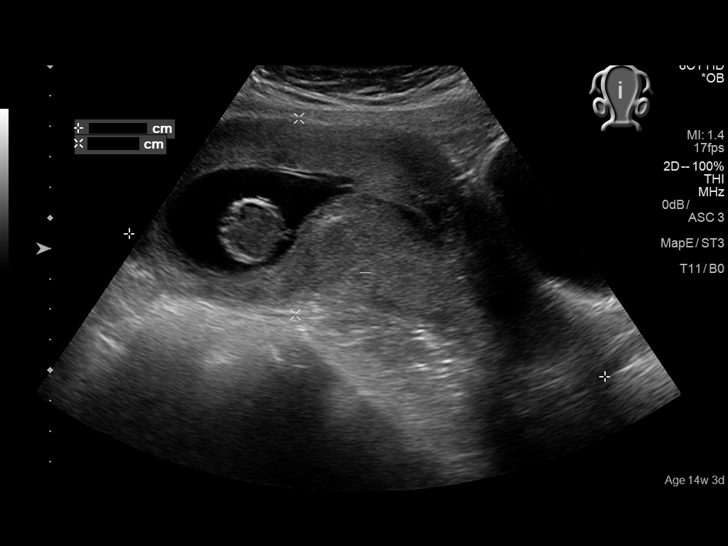
[im 9/49]
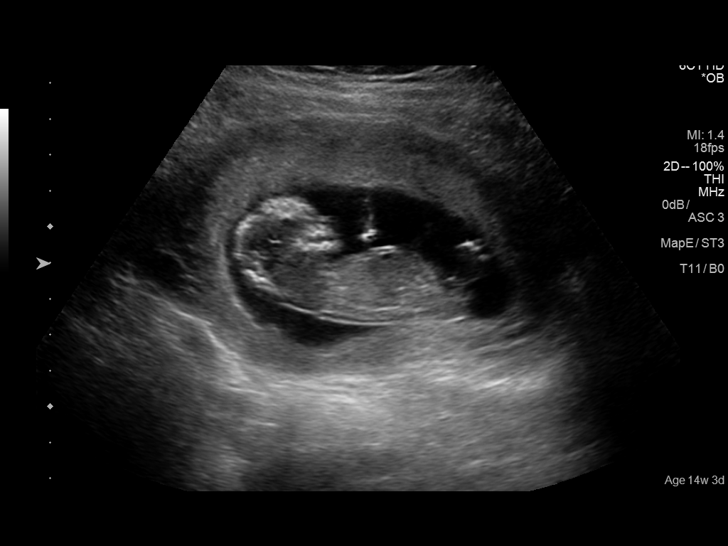
[im 13/49]
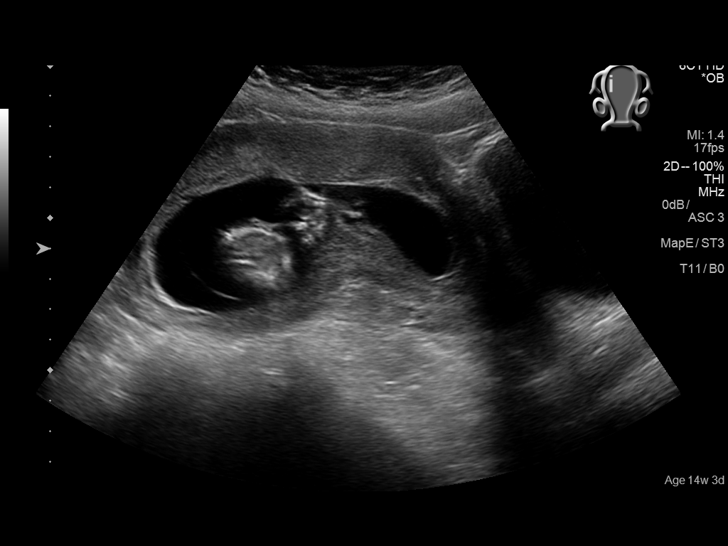
[im 17/49]
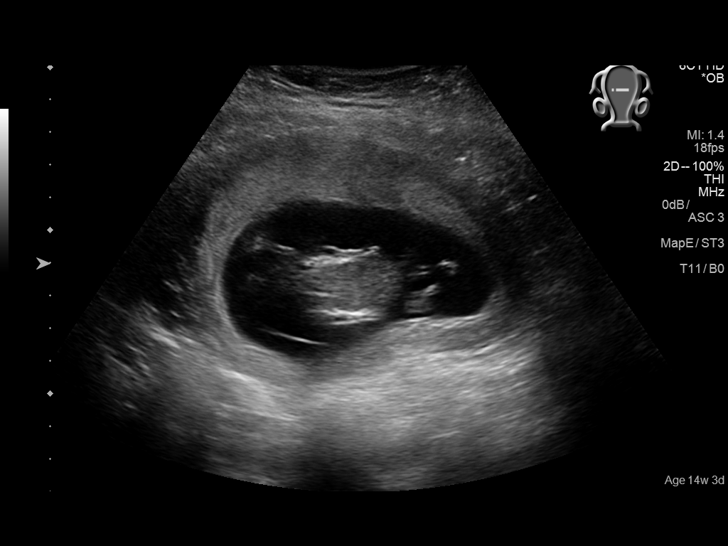
[im 20/49]
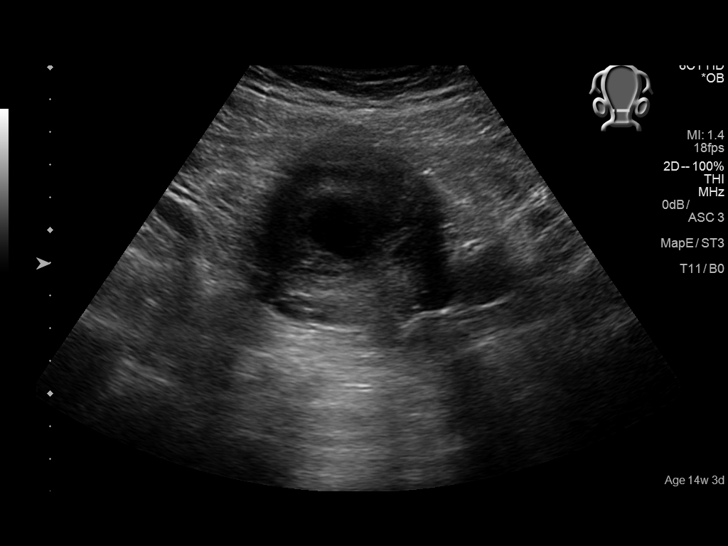
[im 24/49]
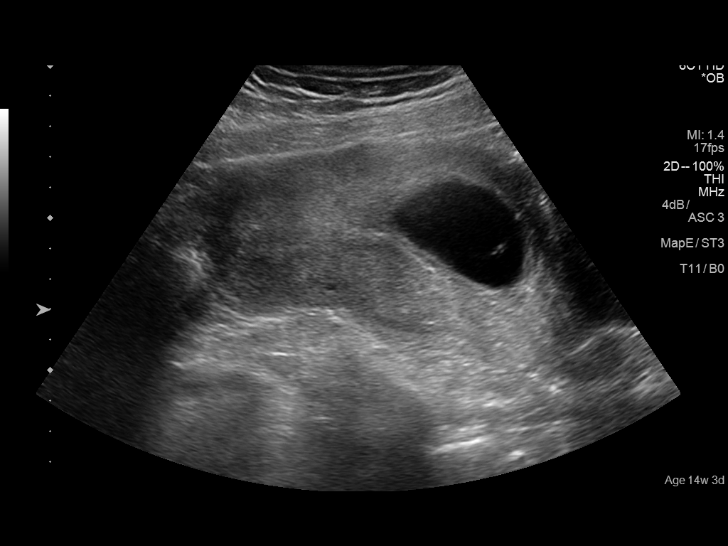
[im 27/49]
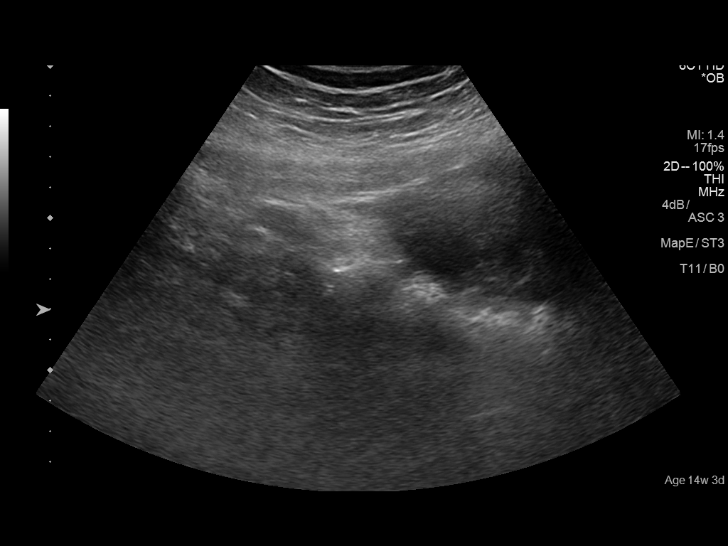
[im 31/49]
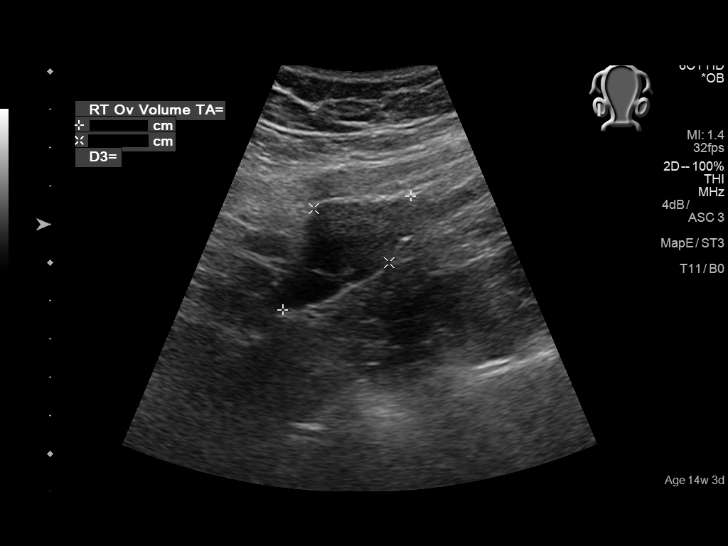
[im 34/49]
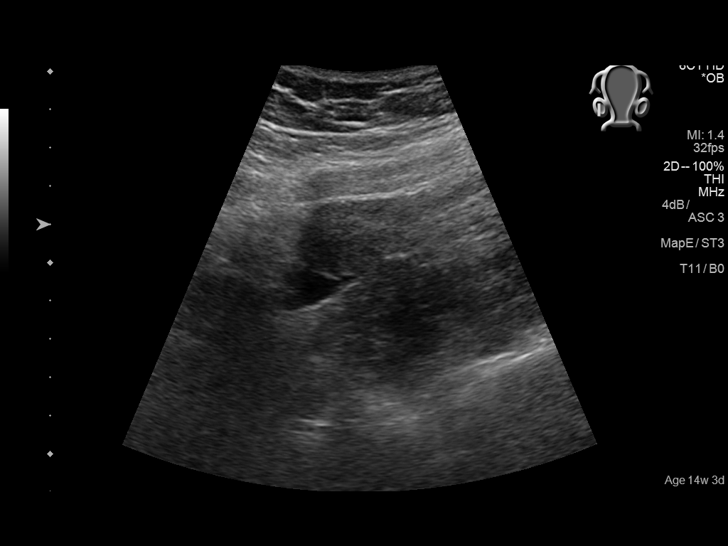
[im 38/49]
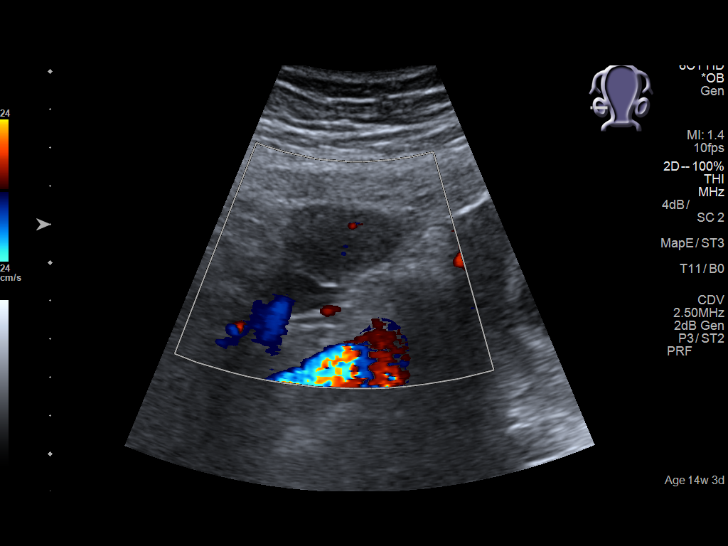
[im 41/49]
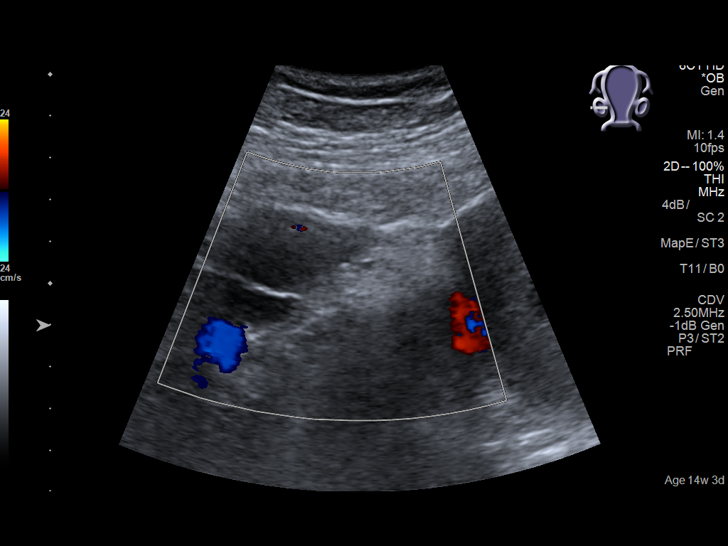
[im 45/49]
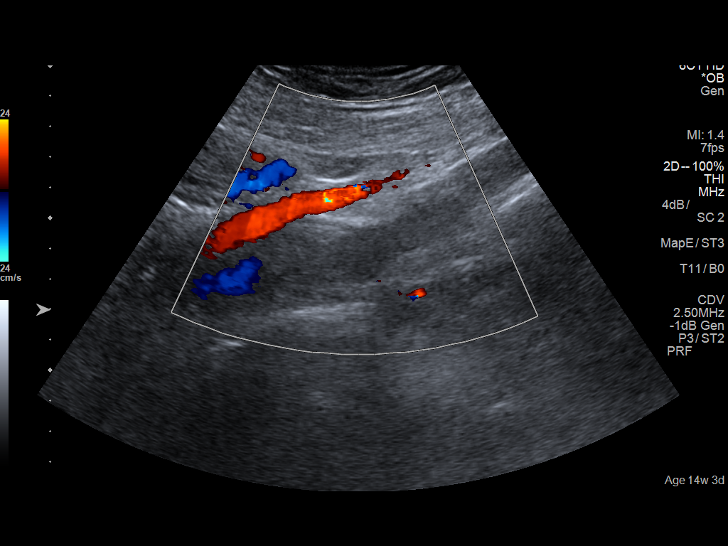
[im 49/49]
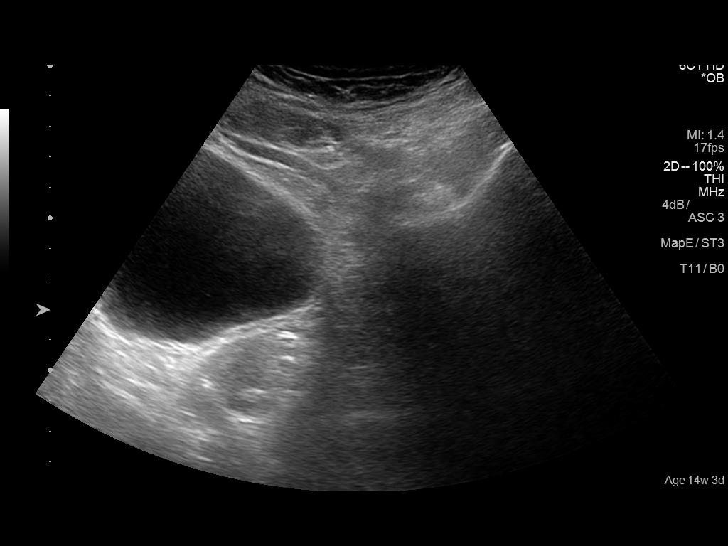

[14 of 28 positions shown; findings below may reference images not displayed]

FINDINGS: Intrauterine gestational sac: Single visualized.

Yolk sac:  Not visualized

Embryo:  Visualized.

Cardiac Activity: Visualized.

Heart Rate: 157 bpm

CRL:   69  mm   13 w 1 d                  US EDC: 06/22/2016

Subchorionic hemorrhage:  None visualized.

Maternal uterus/adnexae: Left ovary not visualized. 2.2 cm
peripheral right ovarian cyst versus paraovarian cyst. No free
pelvic fluid.
IMPRESSION: Single live IUP with estimated gestational age 13 weeks 1 day.

## 2021-04-26 ENCOUNTER — Other Ambulatory Visit: Payer: Self-pay

## 2021-04-26 ENCOUNTER — Emergency Department
Admission: EM | Admit: 2021-04-26 | Discharge: 2021-04-26 | Disposition: A | Discharge status: Home / Self Care | Payer: Self-pay | Attending: Emergency Medicine | Admitting: Emergency Medicine

## 2021-04-26 ENCOUNTER — Encounter: Payer: Self-pay | Admitting: Emergency Medicine

## 2021-04-26 ENCOUNTER — Emergency Department: Payer: Self-pay

## 2021-04-26 DIAGNOSIS — Z7982 Long term (current) use of aspirin: Secondary | ICD-10-CM | POA: Insufficient documentation

## 2021-04-26 DIAGNOSIS — Z87891 Personal history of nicotine dependence: Secondary | ICD-10-CM | POA: Insufficient documentation

## 2021-04-26 DIAGNOSIS — X58XXXA Exposure to other specified factors, initial encounter: Secondary | ICD-10-CM | POA: Insufficient documentation

## 2021-04-26 DIAGNOSIS — S39012A Strain of muscle, fascia and tendon of lower back, initial encounter: Secondary | ICD-10-CM | POA: Insufficient documentation

## 2021-04-26 DIAGNOSIS — R109 Unspecified abdominal pain: Secondary | ICD-10-CM | POA: Insufficient documentation

## 2021-04-26 LAB — POC URINE PREG, ED: Preg Test, Ur: NEGATIVE

## 2021-04-26 MED ORDER — HYDROMORPHONE HCL 1 MG/ML IJ SOLN
1.0000 mg | Freq: Once | INTRAMUSCULAR | Status: AC
  Administered 2021-04-26: 1 mg via INTRAMUSCULAR
  Filled 2021-04-26: qty 1

## 2021-04-26 MED ORDER — KETOROLAC TROMETHAMINE 60 MG/2ML IM SOLN
60.0000 mg | Freq: Once | INTRAMUSCULAR | Status: AC
  Administered 2021-04-26: 60 mg via INTRAMUSCULAR
  Filled 2021-04-26: qty 2

## 2021-04-26 MED ORDER — OXYCODONE-ACETAMINOPHEN 7.5-325 MG PO TABS: 1.0000 | ORAL_TABLET | Freq: Four times a day (QID) | ORAL | 0 refills | Status: AC | PRN

## 2021-04-26 MED ORDER — ORPHENADRINE CITRATE ER 100 MG PO TB12: 100.0000 mg | ORAL_TABLET | Freq: Two times a day (BID) | ORAL | 0 refills | Status: AC

## 2021-04-26 MED ORDER — ORPHENADRINE CITRATE 30 MG/ML IJ SOLN
60.0000 mg | Freq: Two times a day (BID) | INTRAMUSCULAR | Status: DC
  Administered 2021-04-26: 60 mg via INTRAMUSCULAR
  Filled 2021-04-26: qty 2

## 2021-04-26 NOTE — ED Notes (Signed)
Patient transported to CT 

## 2021-04-26 NOTE — ED Notes (Signed)
Urine preg NEG. 

## 2021-04-26 NOTE — ED Triage Notes (Addendum)
Pt here with left lower back pain and left leg pain that began this am, tearful in triage, has hx of sciatic pain. NAD. Also states urine has had a "funky smell" the past few days.

## 2021-04-26 NOTE — Discharge Instructions (Addendum)
No acute findings on CT and urinalysis.  Read and follow discharge care instruction.  Take medication as directed.  Be advised medication may cause drowsiness.

## 2021-04-26 NOTE — ED Provider Notes (Signed)
[facility] Emergency Department Provider Note   ____________________________________________   None    (approximate)  I have reviewed the triage vital signs and the nursing notes.   HISTORY  Chief Complaint Back Pain and Leg Pain    HPI Cynthia Francis is a 25 y.o. female patient presents with left flank pain that radiates down her leg.  No provocative action for complaint.  Patient states pain also radiates across her abdomen.  Patient states urine has of foul smell.  Patient denies dysuria, frequency, or urgency.  Patient rates her back pain as a 10/10.  Patient scribes pain as "achy".  No palliative measure for complaint.         Past Medical History:  Diagnosis Date   Anxiety    Bipolar 2 disorder (HCC)    Depression    Migraine    Tachycardia     Patient Active Problem List   Diagnosis Date Noted   Postpartum care following vaginal delivery 06/27/2016   Labor and delivery indication for care or intervention 06/26/2016   Vaginal discharge during pregnancy 06/18/2016   Pregnancy with abdominal pain of right upper quadrant, antepartum 04/09/2016   Neck pain 01/19/2015   Low back pain 01/19/2015   Bipolar 2 disorder (HCC)    Migraine    Tachycardia    Chest pain 12/08/2013   Rapid heart beat 12/08/2013    Past Surgical History:  Procedure Laterality Date   CHOLECYSTECTOMY      Prior to Admission medications   Medication Sig Start Date End Date Taking? Authorizing Provider  orphenadrine (NORFLEX) 100 MG tablet Take 1 tablet (100 mg total) by mouth 2 (two) times daily. 04/26/21  Yes Joni Reining, PA-C  oxyCODONE-acetaminophen (PERCOCET) 7.5-325 MG tablet Take 1 tablet by mouth every 6 (six) hours as needed for up to 5 days. 04/26/21 05/01/21 Yes Joni Reining, PA-C  acetaminophen (TYLENOL) 160 MG chewable tablet Chew 320 mg by mouth every 6 (six) hours as needed for pain. Reported on 07/11/2015    [provider]  aspirin  81 MG chewable tablet Chew 81 mg by mouth daily.    [provider]  Prenatal Vit-Fe Fumarate-FA (PRENATAL MULTIVITAMIN) TABS tablet Take 1 tablet by mouth daily at 12 noon. Reported on 07/11/2015    [provider]    Allergies Imitrex [sumatriptan]  Family History  Problem Relation Age of Onset   Thyroid disease Mother    Arthritis Brother    Hypertension Maternal Grandmother    Hyperlipidemia Maternal Grandfather     Social History Social History   Tobacco Use   Smoking status: Former    Packs/day: 0.50    Years: 2.00    Pack years: 1.00    Types: Cigarettes   Smokeless tobacco: Never  Substance Use Topics   Alcohol use: No    Comment: social   Drug use: No    Review of Systems Constitutional: No fever/chills Eyes: No visual changes. ENT: No sore throat. Cardiovascular: Denies chest pain. Respiratory: Denies shortness of breath. Gastrointestinal: No abdominal pain.  No nausea, no vomiting.  No diarrhea.  No constipation. Genitourinary: Negative for dysuria. Musculoskeletal: Negative for back pain. Skin: Negative for rash. Neurological: Negative for headaches, focal weakness or numbness. Psychiatric: Anxiety, bipolar, and depression.   Allergic/Immunilogical: Imitrex  ____________________________________________   PHYSICAL EXAM:  VITAL SIGNS: ED Triage Vitals  Enc Vitals Group     BP 04/26/21 1249 112/77     Pulse  Rate 04/26/21 1249 89     Resp 04/26/21 1249 (!) 21     Temp 04/26/21 1249 98.1 F (36.7 C)     Temp Source 04/26/21 1249 Oral     SpO2 04/26/21 1249 100 %     Weight --      Height --      Head Circumference --      Peak Flow --      Pain Score 04/26/21 1203 10     Pain Loc --      Pain Edu? --      Excl. in GC? --     Constitutional: Alert and oriented. Well appearing and in no acute distress. Cardiovascular: Normal rate, regular rhythm. Grossly normal heart sounds.  Good peripheral circulation. Respiratory:  Normal respiratory effort.  No retractions. Lungs CTAB. Gastrointestinal: Body habitus hinders the exam.  Soft and nontender. No distention. No abdominal bruits. No CVA tenderness. Genitourinary: Deferred Musculoskeletal: No lower extremity tenderness nor edema.  No joint effusions. Neurologic:  Normal speech and language. No gross focal neurologic deficits are appreciated. No gait instability. Skin:  Skin is warm, dry and intact. No rash noted. Psychiatric: Mood and affect are normal. Speech and behavior are normal.  ____________________________________________   LABS (all labs ordered are listed, but only abnormal results are displayed)  Labs Reviewed  URINALYSIS, COMPLETE (UACMP) WITH MICROSCOPIC - Abnormal; Notable for the following components:      Result Value   Color, Urine YELLOW (*)    APPearance HAZY (*)    Hgb urine dipstick MODERATE (*)    Leukocytes,Ua SMALL (*)    All other components within normal limits  POC URINE PREG, ED   ____________________________________________  EKG   ____________________________________________  RADIOLOGY I, Joni Reining, personally viewed and evaluated these images (plain radiographs) as part of my medical decision making, as well as reviewing the written report by the radiologist.  ED MD interpretation:    Official radiology report(s): No results found.  ____________________________________________   PROCEDURES  Procedure(s) performed (including Critical Care):  Procedures   ____________________________________________   INITIAL IMPRESSION / ASSESSMENT AND PLAN / ED COURSE  As part of my medical decision making, I reviewed the following data within the electronic MEDICAL RECORD NUMBER         Patient presents for cute onset of left back pain with radicular component to the left lower extremity.  Denies bladder or bowel dysfunction.  Discussed no acute findings on urinalysis and CT for renal stone.  Patient complaining  physical exam consistent with lumbar strain.  Patient given discharge care instruction and prescription for Percocets and Norflex.  Advised to follow-up with PCP.     ____________________________________________   FINAL CLINICAL IMPRESSION(S) / ED DIAGNOSES  Final diagnoses:  Strain of lumbar region, initial encounter     ED Discharge Orders          Ordered    oxyCODONE-acetaminophen (PERCOCET) 7.5-325 MG tablet  Every 6 hours PRN        04/26/21 1525    orphenadrine (NORFLEX) 100 MG tablet  2 times daily        04/26/21 1525             Note:  This document was prepared using Dragon voice recognition software and may include unintentional dictation errors.    Joni Reining, PA-C 04/26/21 1528    Sharyn Creamer, MD 05/08/21 [phone]

## 2021-04-27 LAB — URINALYSIS, COMPLETE (UACMP) WITH MICROSCOPIC
Bilirubin Urine: NEGATIVE
Glucose, UA: NEGATIVE mg/dL
Ketones, ur: NEGATIVE mg/dL
Nitrite: NEGATIVE
Protein, ur: NEGATIVE mg/dL
Specific Gravity, Urine: 1.023 (ref 1.005–1.030)
pH: 5 (ref 5.0–8.0)
